# Patient Record
Sex: Female | Born: 1935 | Race: Black or African American | Hispanic: No | Marital: Single | Smoking: Never smoker
Health system: Southern US, Community
[De-identification: ages and names within clinical notes are randomized; demographics above are authoritative.]

## PROBLEM LIST (undated history)

## (undated) DIAGNOSIS — I639 Cerebral infarction, unspecified: Secondary | ICD-10-CM

## (undated) DIAGNOSIS — I10 Essential (primary) hypertension: Secondary | ICD-10-CM

## (undated) DIAGNOSIS — E119 Type 2 diabetes mellitus without complications: Secondary | ICD-10-CM

## (undated) DIAGNOSIS — F0390 Unspecified dementia without behavioral disturbance: Secondary | ICD-10-CM

## (undated) DIAGNOSIS — F32A Depression, unspecified: Secondary | ICD-10-CM

## (undated) DIAGNOSIS — K219 Gastro-esophageal reflux disease without esophagitis: Secondary | ICD-10-CM

## (undated) DIAGNOSIS — D649 Anemia, unspecified: Secondary | ICD-10-CM

## (undated) DIAGNOSIS — I219 Acute myocardial infarction, unspecified: Secondary | ICD-10-CM

## (undated) DIAGNOSIS — F329 Major depressive disorder, single episode, unspecified: Secondary | ICD-10-CM

## (undated) DIAGNOSIS — N189 Chronic kidney disease, unspecified: Secondary | ICD-10-CM

## (undated) HISTORY — PX: CHOLECYSTECTOMY: SHX55

---

## 2011-03-01 ENCOUNTER — Emergency Department: Payer: Self-pay | Admitting: Emergency Medicine

## 2013-06-16 ENCOUNTER — Emergency Department: Payer: Self-pay | Admitting: Emergency Medicine

## 2013-06-16 LAB — COMPREHENSIVE METABOLIC PANEL
ALT: 22 U/L (ref 12–78)
ANION GAP: 6 — AB (ref 7–16)
Albumin: 3.4 g/dL (ref 3.4–5.0)
Alkaline Phosphatase: 115 U/L
BUN: 45 mg/dL — ABNORMAL HIGH (ref 7–18)
Bilirubin,Total: 0.3 mg/dL (ref 0.2–1.0)
CALCIUM: 8.3 mg/dL — AB (ref 8.5–10.1)
CHLORIDE: 112 mmol/L — AB (ref 98–107)
Co2: 22 mmol/L (ref 21–32)
Creatinine: 1.53 mg/dL — ABNORMAL HIGH (ref 0.60–1.30)
EGFR (African American): 35 — ABNORMAL LOW
EGFR (Non-African Amer.): 30 — ABNORMAL LOW
Glucose: 243 mg/dL — ABNORMAL HIGH (ref 65–99)
Osmolality: 299 (ref 275–301)
Potassium: 5.1 mmol/L (ref 3.5–5.1)
SGOT(AST): 9 U/L — ABNORMAL LOW (ref 15–37)
SODIUM: 140 mmol/L (ref 136–145)
Total Protein: 7.1 g/dL (ref 6.4–8.2)

## 2013-06-16 LAB — URINALYSIS, COMPLETE
Specific Gravity: 1.014 (ref 1.003–1.030)
WBC UR: 69 /HPF (ref 0–5)

## 2013-06-16 LAB — CBC
HCT: 29.3 % — ABNORMAL LOW (ref 35.0–47.0)
HGB: 9.4 g/dL — ABNORMAL LOW (ref 12.0–16.0)
MCH: 27.4 pg (ref 26.0–34.0)
MCHC: 31.9 g/dL — AB (ref 32.0–36.0)
MCV: 86 fL (ref 80–100)
Platelet: 134 10*3/uL — ABNORMAL LOW (ref 150–440)
RBC: 3.41 10*6/uL — ABNORMAL LOW (ref 3.80–5.20)
RDW: 14.2 % (ref 11.5–14.5)
WBC: 4.9 10*3/uL (ref 3.6–11.0)

## 2013-06-19 LAB — URINE CULTURE

## 2013-10-08 ENCOUNTER — Emergency Department: Payer: Self-pay | Admitting: Internal Medicine

## 2013-10-08 LAB — URINALYSIS, COMPLETE
BILIRUBIN, UR: NEGATIVE
KETONE: NEGATIVE
Nitrite: NEGATIVE
PH: 5 (ref 4.5–8.0)
Protein: 30
RBC,UR: 7 /HPF (ref 0–5)
SPECIFIC GRAVITY: 1.014 (ref 1.003–1.030)
Squamous Epithelial: <1
WBC UR: 8 /HPF (ref 0–5)

## 2013-10-08 LAB — CBC
HCT: 30.2 % — ABNORMAL LOW (ref 35.0–47.0)
HGB: 9.6 g/dL — ABNORMAL LOW (ref 12.0–16.0)
MCH: 27.5 pg (ref 26.0–34.0)
MCHC: 32 g/dL (ref 32.0–36.0)
MCV: 86 fL (ref 80–100)
PLATELETS: 164 10*3/uL (ref 150–440)
RBC: 3.51 10*6/uL — ABNORMAL LOW (ref 3.80–5.20)
RDW: 14 % (ref 11.5–14.5)
WBC: 2.9 10*3/uL — AB (ref 3.6–11.0)

## 2013-10-08 LAB — COMPREHENSIVE METABOLIC PANEL
ALK PHOS: 84 U/L
Albumin: 3.4 g/dL (ref 3.4–5.0)
Anion Gap: 6 — ABNORMAL LOW (ref 7–16)
BUN: 28 mg/dL — AB (ref 7–18)
Bilirubin,Total: 0.4 mg/dL (ref 0.2–1.0)
Calcium, Total: 8.7 mg/dL (ref 8.5–10.1)
Chloride: 111 mmol/L — ABNORMAL HIGH (ref 98–107)
Co2: 23 mmol/L (ref 21–32)
Creatinine: 1.13 mg/dL (ref 0.60–1.30)
EGFR (African American): 50 — ABNORMAL LOW
GFR CALC NON AF AMER: 43 — AB
Glucose: 235 mg/dL — ABNORMAL HIGH (ref 65–99)
Osmolality: 292 (ref 275–301)
POTASSIUM: 5.1 mmol/L (ref 3.5–5.1)
SGOT(AST): 14 U/L — ABNORMAL LOW (ref 15–37)
SGPT (ALT): 27 U/L (ref 12–78)
SODIUM: 140 mmol/L (ref 136–145)
Total Protein: 7 g/dL (ref 6.4–8.2)

## 2013-10-08 LAB — TROPONIN I: Troponin-I: <0.02 ng/mL

## 2013-10-10 LAB — URINE CULTURE

## 2015-08-03 ENCOUNTER — Inpatient Hospital Stay
Admission: EM | Admit: 2015-08-03 | Discharge: 2015-08-07 | DRG: 871 | Disposition: A | Discharge status: Skilled Nursing Facility | Payer: Medicare Other | Attending: Internal Medicine | Admitting: Internal Medicine

## 2015-08-03 ENCOUNTER — Emergency Department: Payer: Medicare Other

## 2015-08-03 DIAGNOSIS — M47812 Spondylosis without myelopathy or radiculopathy, cervical region: Secondary | ICD-10-CM | POA: Diagnosis present

## 2015-08-03 DIAGNOSIS — Z681 Body mass index (BMI) 19 or less, adult: Secondary | ICD-10-CM

## 2015-08-03 DIAGNOSIS — Z794 Long term (current) use of insulin: Secondary | ICD-10-CM

## 2015-08-03 DIAGNOSIS — Z880 Allergy status to penicillin: Secondary | ICD-10-CM | POA: Diagnosis not present

## 2015-08-03 DIAGNOSIS — F0390 Unspecified dementia without behavioral disturbance: Secondary | ICD-10-CM | POA: Diagnosis present

## 2015-08-03 DIAGNOSIS — N189 Chronic kidney disease, unspecified: Secondary | ICD-10-CM | POA: Diagnosis present

## 2015-08-03 DIAGNOSIS — A419 Sepsis, unspecified organism: Secondary | ICD-10-CM | POA: Diagnosis present

## 2015-08-03 DIAGNOSIS — R6520 Severe sepsis without septic shock: Secondary | ICD-10-CM | POA: Diagnosis present

## 2015-08-03 DIAGNOSIS — F329 Major depressive disorder, single episode, unspecified: Secondary | ICD-10-CM | POA: Diagnosis present

## 2015-08-03 DIAGNOSIS — R778 Other specified abnormalities of plasma proteins: Secondary | ICD-10-CM

## 2015-08-03 DIAGNOSIS — E43 Unspecified severe protein-calorie malnutrition: Secondary | ICD-10-CM | POA: Diagnosis present

## 2015-08-03 DIAGNOSIS — F0280 Dementia in other diseases classified elsewhere without behavioral disturbance: Secondary | ICD-10-CM | POA: Diagnosis present

## 2015-08-03 DIAGNOSIS — Z8673 Personal history of transient ischemic attack (TIA), and cerebral infarction without residual deficits: Secondary | ICD-10-CM | POA: Diagnosis not present

## 2015-08-03 DIAGNOSIS — R410 Disorientation, unspecified: Secondary | ICD-10-CM

## 2015-08-03 DIAGNOSIS — R600 Localized edema: Secondary | ICD-10-CM

## 2015-08-03 DIAGNOSIS — G9340 Encephalopathy, unspecified: Secondary | ICD-10-CM | POA: Diagnosis present

## 2015-08-03 DIAGNOSIS — R7989 Other specified abnormal findings of blood chemistry: Secondary | ICD-10-CM

## 2015-08-03 DIAGNOSIS — I252 Old myocardial infarction: Secondary | ICD-10-CM

## 2015-08-03 DIAGNOSIS — I2510 Atherosclerotic heart disease of native coronary artery without angina pectoris: Secondary | ICD-10-CM | POA: Diagnosis present

## 2015-08-03 DIAGNOSIS — E1122 Type 2 diabetes mellitus with diabetic chronic kidney disease: Secondary | ICD-10-CM | POA: Diagnosis present

## 2015-08-03 DIAGNOSIS — I639 Cerebral infarction, unspecified: Secondary | ICD-10-CM

## 2015-08-03 DIAGNOSIS — I129 Hypertensive chronic kidney disease with stage 1 through stage 4 chronic kidney disease, or unspecified chronic kidney disease: Secondary | ICD-10-CM | POA: Diagnosis present

## 2015-08-03 DIAGNOSIS — E785 Hyperlipidemia, unspecified: Secondary | ICD-10-CM | POA: Diagnosis present

## 2015-08-03 DIAGNOSIS — N390 Urinary tract infection, site not specified: Secondary | ICD-10-CM

## 2015-08-03 DIAGNOSIS — I248 Other forms of acute ischemic heart disease: Secondary | ICD-10-CM | POA: Diagnosis present

## 2015-08-03 DIAGNOSIS — I482 Chronic atrial fibrillation: Secondary | ICD-10-CM | POA: Diagnosis present

## 2015-08-03 DIAGNOSIS — I10 Essential (primary) hypertension: Secondary | ICD-10-CM | POA: Diagnosis present

## 2015-08-03 DIAGNOSIS — D649 Anemia, unspecified: Secondary | ICD-10-CM | POA: Diagnosis present

## 2015-08-03 DIAGNOSIS — K219 Gastro-esophageal reflux disease without esophagitis: Secondary | ICD-10-CM | POA: Diagnosis present

## 2015-08-03 HISTORY — DX: Major depressive disorder, single episode, unspecified: F32.9

## 2015-08-03 HISTORY — DX: Acute myocardial infarction, unspecified: I21.9

## 2015-08-03 HISTORY — DX: Anemia, unspecified: D64.9

## 2015-08-03 HISTORY — DX: Essential (primary) hypertension: I10

## 2015-08-03 HISTORY — DX: Cerebral infarction, unspecified: I63.9

## 2015-08-03 HISTORY — DX: Depression, unspecified: F32.A

## 2015-08-03 HISTORY — DX: Type 2 diabetes mellitus without complications: E11.9

## 2015-08-03 HISTORY — DX: Gastro-esophageal reflux disease without esophagitis: K21.9

## 2015-08-03 HISTORY — DX: Unspecified dementia, unspecified severity, without behavioral disturbance, psychotic disturbance, mood disturbance, and anxiety: F03.90

## 2015-08-03 HISTORY — DX: Chronic kidney disease, unspecified: N18.9

## 2015-08-03 LAB — CBC WITH DIFFERENTIAL/PLATELET
Basophils Absolute: 0 10*3/uL (ref 0–0.1)
Basophils Relative: 0 %
EOS ABS: 0 10*3/uL (ref 0–0.7)
Eosinophils Relative: 0 %
HEMATOCRIT: 31.5 % — AB (ref 35.0–47.0)
HEMOGLOBIN: 10.1 g/dL — AB (ref 12.0–16.0)
LYMPHS ABS: 0.4 10*3/uL — AB (ref 1.0–3.6)
Lymphocytes Relative: 5 %
MCH: 27.1 pg (ref 26.0–34.0)
MCHC: 32 g/dL (ref 32.0–36.0)
MCV: 84.6 fL (ref 80.0–100.0)
MONO ABS: 0.1 10*3/uL — AB (ref 0.2–0.9)
MONOS PCT: 1 %
NEUTROS PCT: 94 %
Neutro Abs: 8.8 10*3/uL — ABNORMAL HIGH (ref 1.4–6.5)
Platelets: 188 10*3/uL (ref 150–440)
RBC: 3.72 MIL/uL — ABNORMAL LOW (ref 3.80–5.20)
RDW: 16.9 % — AB (ref 11.5–14.5)
WBC: 9.3 10*3/uL (ref 3.6–11.0)

## 2015-08-03 LAB — COMPREHENSIVE METABOLIC PANEL
ALBUMIN: 4.1 g/dL (ref 3.5–5.0)
ALT: 30 U/L (ref 14–54)
AST: 27 U/L (ref 15–41)
Alkaline Phosphatase: 99 U/L (ref 38–126)
Anion gap: 9 (ref 5–15)
BILIRUBIN TOTAL: 0.7 mg/dL (ref 0.3–1.2)
BUN: 37 mg/dL — AB (ref 6–20)
CO2: 20 mmol/L — ABNORMAL LOW (ref 22–32)
CREATININE: 1.5 mg/dL — AB (ref 0.44–1.00)
Calcium: 8.9 mg/dL (ref 8.9–10.3)
Chloride: 110 mmol/L (ref 101–111)
GFR calc Af Amer: 34 mL/min — ABNORMAL LOW (ref >60)
GFR, EST NON AFRICAN AMERICAN: 29 mL/min — AB (ref >60)
GLUCOSE: 315 mg/dL — AB (ref 65–99)
POTASSIUM: 5.6 mmol/L — AB (ref 3.5–5.1)
Sodium: 139 mmol/L (ref 135–145)
TOTAL PROTEIN: 8.1 g/dL (ref 6.5–8.1)

## 2015-08-03 LAB — TROPONIN I
Troponin I: 0.21 ng/mL — ABNORMAL HIGH (ref <0.031)
Troponin I: 0.33 ng/mL — ABNORMAL HIGH (ref <0.031)
Troponin I: 0.37 ng/mL — ABNORMAL HIGH (ref <0.031)

## 2015-08-03 LAB — PROTIME-INR
INR: 1.01
Prothrombin Time: 13.5 seconds (ref 11.4–15.0)

## 2015-08-03 LAB — GLUCOSE, CAPILLARY
Glucose-Capillary: 312 mg/dL — ABNORMAL HIGH (ref 65–99)
Glucose-Capillary: 241 mg/dL — ABNORMAL HIGH (ref 65–99)

## 2015-08-03 LAB — RAPID INFLUENZA A&B ANTIGENS (ARMC ONLY)
INFLUENZA A (ARMC): NEGATIVE
INFLUENZA B (ARMC): NEGATIVE

## 2015-08-03 LAB — URINALYSIS COMPLETE WITH MICROSCOPIC (ARMC ONLY)
Bilirubin Urine: NEGATIVE
GLUCOSE, UA: 150 mg/dL — AB
HGB URINE DIPSTICK: NEGATIVE
NITRITE: NEGATIVE
Protein, ur: >500 mg/dL — AB
SPECIFIC GRAVITY, URINE: 1.015 (ref 1.005–1.030)
pH: 9 — ABNORMAL HIGH (ref 5.0–8.0)

## 2015-08-03 LAB — BRAIN NATRIURETIC PEPTIDE: B NATRIURETIC PEPTIDE 5: 812 pg/mL — AB (ref 0.0–100.0)

## 2015-08-03 LAB — POTASSIUM: Potassium: 5.3 mmol/L — ABNORMAL HIGH (ref 3.5–5.1)

## 2015-08-03 LAB — APTT: APTT: 30 s (ref 24–36)

## 2015-08-03 LAB — LACTIC ACID, PLASMA
LACTIC ACID, VENOUS: 1.8 mmol/L (ref 0.5–2.0)
Lactic Acid, Venous: 1.9 mmol/L (ref 0.5–2.0)

## 2015-08-03 LAB — MRSA PCR SCREENING: MRSA by PCR: NEGATIVE

## 2015-08-03 MED ORDER — ACETAMINOPHEN 650 MG RE SUPP
650.0000 mg | Freq: Four times a day (QID) | RECTAL | Status: DC | PRN
Start: 2015-08-03 — End: 2015-08-07

## 2015-08-03 MED ORDER — DOCUSATE SODIUM 100 MG PO CAPS: 100.0000 mg | ORAL_CAPSULE | Freq: Two times a day (BID) | ORAL | Status: DC

## 2015-08-03 MED ORDER — SODIUM CHLORIDE 0.9% FLUSH
3.0000 mL | Freq: Two times a day (BID) | INTRAVENOUS | Status: DC
  Administered 2015-08-03 – 2015-08-06 (×7): 3 mL via INTRAVENOUS

## 2015-08-03 MED ORDER — ACETAMINOPHEN 325 MG PO TABS: 650.0000 mg | ORAL_TABLET | Freq: Four times a day (QID) | ORAL | Status: DC | PRN

## 2015-08-03 MED ORDER — VANCOMYCIN HCL IN DEXTROSE 1-5 GM/200ML-% IV SOLN
1000.0000 mg | Freq: Once | INTRAVENOUS | Status: AC
  Administered 2015-08-03: 1000 mg via INTRAVENOUS
  Filled 2015-08-03: qty 200

## 2015-08-03 MED ORDER — ONDANSETRON HCL 4 MG PO TABS: 4.0000 mg | ORAL_TABLET | Freq: Four times a day (QID) | ORAL | Status: DC | PRN

## 2015-08-03 MED ORDER — TAMSULOSIN HCL 0.4 MG PO CAPS: 0.4000 mg | ORAL_CAPSULE | Freq: Every day | ORAL | Status: DC

## 2015-08-03 MED ORDER — AZTREONAM 1 G IJ SOLR
1.0000 g | Freq: Three times a day (TID) | INTRAMUSCULAR | Status: DC
  Administered 2015-08-03 – 2015-08-07 (×12): 1 g via INTRAVENOUS
  Filled 2015-08-03 (×15): qty 1

## 2015-08-03 MED ORDER — BISACODYL 10 MG RE SUPP: 10.0000 mg | Freq: Every day | RECTAL | Status: DC | PRN

## 2015-08-03 MED ORDER — SODIUM CHLORIDE 0.9 % IV BOLUS (SEPSIS): 1000.0000 mL | Freq: Once | INTRAVENOUS | Status: DC

## 2015-08-03 MED ORDER — ASPIRIN EC 81 MG PO TBEC
81.0000 mg | DELAYED_RELEASE_TABLET | Freq: Every day | ORAL | Status: DC
  Administered 2015-08-05 – 2015-08-07 (×3): 81 mg via ORAL
  Filled 2015-08-03 (×3): qty 1

## 2015-08-03 MED ORDER — NITROGLYCERIN 2 % TD OINT
2.0000 [in_us] | TOPICAL_OINTMENT | Freq: Four times a day (QID) | TRANSDERMAL | Status: DC
  Administered 2015-08-03 – 2015-08-06 (×11): 2 [in_us] via TOPICAL
  Filled 2015-08-03 (×10): qty 2

## 2015-08-03 MED ORDER — INSULIN ASPART 100 UNIT/ML ~~LOC~~ SOLN
0.0000 [IU] | Freq: Three times a day (TID) | SUBCUTANEOUS | Status: DC
  Administered 2015-08-03: 7 [IU] via SUBCUTANEOUS
  Administered 2015-08-04: 3 [IU] via SUBCUTANEOUS
  Filled 2015-08-03: qty 3
  Filled 2015-08-03: qty 7
  Filled 2015-08-03: qty 5

## 2015-08-03 MED ORDER — MORPHINE SULFATE (PF) 2 MG/ML IV SOLN
2.0000 mg | INTRAVENOUS | Status: DC | PRN
  Administered 2015-08-03: 2 mg via INTRAVENOUS
  Filled 2015-08-03 (×3): qty 1

## 2015-08-03 MED ORDER — SODIUM CHLORIDE 0.9 % IV BOLUS (SEPSIS)
1314.0000 mL | Freq: Once | INTRAVENOUS | Status: AC
  Administered 2015-08-03: 1314 mL via INTRAVENOUS

## 2015-08-03 MED ORDER — HYDRALAZINE HCL 20 MG/ML IJ SOLN
5.0000 mg | Freq: Once | INTRAMUSCULAR | Status: AC
  Administered 2015-08-03: 5 mg via INTRAVENOUS
  Filled 2015-08-03: qty 1

## 2015-08-03 MED ORDER — LORAZEPAM 2 MG/ML IJ SOLN
0.5000 mg | INTRAMUSCULAR | Status: DC | PRN
  Administered 2015-08-04 – 2015-08-07 (×4): 0.5 mg via INTRAVENOUS
  Filled 2015-08-03 (×4): qty 1

## 2015-08-03 MED ORDER — DONEPEZIL HCL 5 MG PO TABS: 5.0000 mg | ORAL_TABLET | Freq: Every day | ORAL | Status: DC

## 2015-08-03 MED ORDER — SODIUM CHLORIDE 0.9 % IV SOLN
INTRAVENOUS | Status: DC
  Administered 2015-08-03 – 2015-08-05 (×4): via INTRAVENOUS

## 2015-08-03 MED ORDER — ACETAMINOPHEN 650 MG RE SUPP
650.0000 mg | Freq: Once | RECTAL | Status: AC
  Administered 2015-08-03: 650 mg via RECTAL
  Filled 2015-08-03: qty 1

## 2015-08-03 MED ORDER — MIRTAZAPINE 15 MG PO TABS
7.5000 mg | ORAL_TABLET | Freq: Every day | ORAL | Status: DC
  Administered 2015-08-05 – 2015-08-06 (×2): 7.5 mg via ORAL
  Filled 2015-08-03 (×2): qty 1

## 2015-08-03 MED ORDER — ONDANSETRON HCL 4 MG/2ML IJ SOLN: 4.0000 mg | Freq: Four times a day (QID) | INTRAMUSCULAR | Status: DC | PRN

## 2015-08-03 MED ORDER — PANTOPRAZOLE SODIUM 40 MG PO TBEC: 40.0000 mg | DELAYED_RELEASE_TABLET | Freq: Every day | ORAL | Status: DC

## 2015-08-03 MED ORDER — DEXTROSE 5 % IV SOLN
2.0000 g | Freq: Three times a day (TID) | INTRAVENOUS | Status: DC
  Administered 2015-08-03: 2 g via INTRAVENOUS
  Filled 2015-08-03 (×4): qty 2

## 2015-08-03 MED ORDER — HYDRALAZINE HCL 25 MG PO TABS
50.0000 mg | ORAL_TABLET | Freq: Three times a day (TID) | ORAL | Status: DC
  Filled 2015-08-03: qty 2

## 2015-08-03 MED ORDER — HEPARIN SODIUM (PORCINE) 5000 UNIT/ML IJ SOLN
5000.0000 [IU] | Freq: Three times a day (TID) | INTRAMUSCULAR | Status: DC
Start: 2015-08-03 — End: 2015-08-04
  Administered 2015-08-03 – 2015-08-04 (×2): 5000 [IU] via SUBCUTANEOUS
  Filled 2015-08-03 (×2): qty 1

## 2015-08-03 MED ORDER — HYDRALAZINE HCL 20 MG/ML IJ SOLN
10.0000 mg | INTRAMUSCULAR | Status: DC | PRN
  Administered 2015-08-03 – 2015-08-04 (×4): 10 mg via INTRAVENOUS
  Filled 2015-08-03 (×4): qty 1

## 2015-08-03 NOTE — Progress Notes (Signed)
PT Cancellation Note  Patient Details Name: Stephanie Willis MRN: [id number] DOB: 08/06/1921   Cancelled Treatment:    Reason Eval/Treat Not Completed: Patient not medically ready Pt has elevated troponin, BP and glucose levels.  Pt is not appropriate for PT at this time.   Loran SentersGalen Waino Mounsey, PT, DPT [phone]  Malachi ProGalen R Ediberto Sens 08/03/2015, 4:35 PM

## 2015-08-03 NOTE — H&P (Signed)
History and Physical    Stephanie Willis ONG:[id number] DOB: 03-04-1922 DOA: 08/03/2015  Referring physician: Dr. Inocencio Homes PCP: No primary care provider on file.  Specialists: none  Chief Complaint: confusion  HPI: Stephanie Willis is a 82 y.o. female has a past medical history significant for CAD s/p MI, Dementia, previous stroke, HTN, DM, and OAB who resides at [facility]. According to the SNF, the pt is demented and agitated at baseline. Brother states she was well 4 days ago but is now severely confused and agitated. She was brought to the ER today where she was noted to be febrile and tachycardic. UA was grossly abnormal. SNF states she has had a left gaze preference. CT of head and neck in ER unchanged, The pt is unable to provide hx. She is now admitted for presumed urosepsis. Of note, troponin was also elevated ing ER and glucose=315.  Review of Systems: unable to obtain from patient due to dementia and delirium. Brother has not had any contact with her in 4 days.  Past Medical History  Diagnosis Date  . Stroke (HCC)   . Myocardial infarction (HCC)   . Anemia   . Depression   . Dementia   . Diabetes mellitus without complication (HCC)   . Hypertension   . Chronic kidney disease   . GERD (gastroesophageal reflux disease)    Past Surgical History  Procedure Laterality Date  . Cholecystectomy     Social History:  reports that she has never smoked. She does not have any smokeless tobacco history on file. Her alcohol and drug histories are not on file.  Allergies  Allergen Reactions  . Penicillins     Other reaction(s): Unknown    Family History  Problem Relation Age of Onset  . Family history unknown: Yes    Prior to Admission medications   Not on File   Physical Exam: Filed Vitals:   08/03/15 0839 08/03/15 0900 08/03/15 0949 08/03/15 0951  BP: 226/95 171/131 208/111 216/91  Pulse:  85  101  Temp:      TempSrc:      Resp:  19    Height:      Weight:      SpO2:  97%        General:  Acutely ill appearing, agitated, non-verbal. [place]/AT, mild cachexia  Eyes: PERRL, EOMI, no scleral icterus, conjunctiva clear  ENT: dry oropharynx with poor dentition, TM's benign. No lesions or exudate  Neck: supple, no lymphadenopathy. No thyromegaly or bruits noted  Cardiovascular: regular rate without MRG; 2+ peripheral pulses, no JVD, 1+ peripheral edema on right  Respiratory: diffuse rhonchi without wheezes or rales. No dullness. Respiratory effort increased.  Abdomen: soft, non tender to palpation, positive bowel sounds, no guarding, no rebound, no organomegaly  Skin: no rashes or lesions  Musculoskeletal: assymetry of LE ? Due to atrophy of LLE, no joint swelling  Psychiatric: confused, non-verbal, agitated  Neurologic: CN 2-12 grossly intact, MS 4/5 on left, DTR's diminished, stocking glove neuropathy noted  Labs on Admission:  Basic Metabolic Panel:  Recent Labs Lab 08/03/15 0817  NA 139  K 5.6*  CL 110  CO2 20*  GLUCOSE 315*  BUN 37*  CREATININE 1.50*  CALCIUM 8.9   Liver Function Tests:  Recent Labs Lab 08/03/15 0817  AST 27  ALT 30  ALKPHOS 99  BILITOT 0.7  PROT 8.1  ALBUMIN 4.1   No results for input(s): LIPASE, AMYLASE in the last 168 hours. No results for input(s):  AMMONIA in the last 168 hours. CBC:  Recent Labs Lab 08/03/15 0817  WBC 9.3  NEUTROABS 8.8*  HGB 10.1*  HCT 31.5*  MCV 84.6  PLT 188   Cardiac Enzymes:  Recent Labs Lab 08/03/15 0817  TROPONINI 0.21*    BNP (last 3 results)  Recent Labs  08/03/15 0817  BNP 812.0*    ProBNP (last 3 results) No results for input(s): PROBNP in the last 8760 hours.  CBG: No results for input(s): GLUCAP in the last 168 hours.  Radiological Exams on Admission: Ct Head Wo Contrast  08/03/2015  CLINICAL DATA:  Altered mental status, unresponsive, possible unwitnessed trauma EXAM: CT HEAD WITHOUT CONTRAST CT CERVICAL SPINE WITHOUT CONTRAST TECHNIQUE: Multidetector  CT imaging of the head and cervical spine was performed following the standard protocol without intravenous contrast. Multiplanar CT image reconstructions of the cervical spine were also generated. COMPARISON:  10/08/2013 FINDINGS: CT HEAD FINDINGS Stable brain atrophy pattern and chronic white matter microvascular ischemic changes throughout the cerebral hemispheres. Stable partially calcified hyperdense extra-axial mass along the right frontal parafalcine region measure approximately 19 x 14 mm compatible with a meningioma. No acute intracranial hemorrhage, acute infarction, focal mass effect, edema, midline shift, herniation, hydrocephalus, or extra-axial fluid collection. Cisterns are patent. Cerebellar atrophy as well. Orbits are symmetric. Atherosclerosis of the intracranial vessels at the skullbase. Skull appears intact. No depressed fracture. Mastoids and sinuses remain clear. CT CERVICAL SPINE FINDINGS Normal cervical spine alignment without subluxation or dislocation. Slight progression of degenerative disc disease and spondylosis at C2-3 with increased endplate sclerosis and irregularity and osteophyte formation. Similar moderate degenerative changes at C5-6 with anterior overhanging osteophytes. Diffuse multilevel facet arthropathy noted. Odontoid appears intact. Rotation at the C1-2 articulation appears to be positional. No acute displaced fracture evident. Carotid atherosclerosis noted. No soft tissue asymmetry in the neck. Clear lung apices. IMPRESSION: No acute intracranial abnormality or interval change. Stable small right frontal parafalcine Extra-axial mass compatible with a meningioma. Chronic microvascular ischemic changes Diffuse cervical spondylosis and degenerative changes, slight progression since prior study at C2-3. No acute cervical osseous finding or malalignment Electronically Signed   By: Judie Petit.  Shick M.D.   On: 08/03/2015 09:15   Ct Cervical Spine Wo Contrast  08/03/2015  CLINICAL  DATA:  Altered mental status, unresponsive, possible unwitnessed trauma EXAM: CT HEAD WITHOUT CONTRAST CT CERVICAL SPINE WITHOUT CONTRAST TECHNIQUE: Multidetector CT imaging of the head and cervical spine was performed following the standard protocol without intravenous contrast. Multiplanar CT image reconstructions of the cervical spine were also generated. COMPARISON:  10/08/2013 FINDINGS: CT HEAD FINDINGS Stable brain atrophy pattern and chronic white matter microvascular ischemic changes throughout the cerebral hemispheres. Stable partially calcified hyperdense extra-axial mass along the right frontal parafalcine region measure approximately 19 x 14 mm compatible with a meningioma. No acute intracranial hemorrhage, acute infarction, focal mass effect, edema, midline shift, herniation, hydrocephalus, or extra-axial fluid collection. Cisterns are patent. Cerebellar atrophy as well. Orbits are symmetric. Atherosclerosis of the intracranial vessels at the skullbase. Skull appears intact. No depressed fracture. Mastoids and sinuses remain clear. CT CERVICAL SPINE FINDINGS Normal cervical spine alignment without subluxation or dislocation. Slight progression of degenerative disc disease and spondylosis at C2-3 with increased endplate sclerosis and irregularity and osteophyte formation. Similar moderate degenerative changes at C5-6 with anterior overhanging osteophytes. Diffuse multilevel facet arthropathy noted. Odontoid appears intact. Rotation at the C1-2 articulation appears to be positional. No acute displaced fracture evident. Carotid atherosclerosis noted. No soft tissue asymmetry in the  neck. Clear lung apices. IMPRESSION: No acute intracranial abnormality or interval change. Stable small right frontal parafalcine Extra-axial mass compatible with a meningioma. Chronic microvascular ischemic changes Diffuse cervical spondylosis and degenerative changes, slight progression since prior study at C2-3. No acute  cervical osseous finding or malalignment Electronically Signed   By: Judie PetitM.  Shick M.D.   On: 08/03/2015 09:15   Dg Chest Port 1 View  08/03/2015  CLINICAL DATA:  Sepsis. EXAM: PORTABLE CHEST 1 VIEW COMPARISON:  03/01/2011 FINDINGS: Borderline cardiomegaly ectasia of the thoracic aorta are stable. Both lungs are clear. No evidence of pneumothorax or pleural effusion. IMPRESSION: Stable exam.  No active disease. Electronically Signed   By: Myles RosenthalJohn  Stahl M.D.   On: 08/03/2015 08:55    EKG: Independently reviewed.  Assessment/Plan Principal Problem:   Sepsis (HCC) Active Problems:   UTI (lower urinary tract infection)   Delirium   Elevated troponin   Will admit to ICU as FULL CODE per pt's brother with IV fluids and IV ABX. Cultures sent. Will consult PT, CSW, and Palliative Care. Follow sugars with SSI. Monitor BP closely with prn IV Hydralazine. Consult Cardiology due to elevated troponin. Order echo. Repeat labs in AM. Prognosis poor. Family aware.  Diet: NPO Fluids: NS@100  DVT Prophylaxis: SQ Heparin  Code Status: FULL  Family Communication: yes  Disposition Plan: SNF  Time spent: 50 min

## 2015-08-03 NOTE — ED Notes (Addendum)
Attempted transport to U/S. Unable to at this time due to soiled diaper. Will change and call to transport for u/s. Pending antibiotic from pharmacy

## 2015-08-03 NOTE — ED Provider Notes (Signed)
[facility] Emergency Department Provider Note  ____________________________________________  Time seen: Approximately 8:04 AM  I have reviewed the triage vital signs and the nursing notes.   HISTORY  Chief Complaint Code Sepsis  Caveat-history of present illness review of systems Limited due to the patient's altered mental status/decreased level of responsiveness. All information is obtained from EMS on their arrival as well as staff at [facility] health care.  HPI Kimberlye Willis is a 82 y.o. female with history of severe dementia, diabetes, hyperlipidemia, hypertension, prior CVA who presents for evaluation of decreased level of responsiveness today. According to staff at her living facility she was awake last night, agitated but that is her baseline. This morning she had limited response to any verbal stimulus, was not following commands, and was very quiet which is out of character for her. This morning at approximately 6:30 AM when the morning staff came to check on her, they also noted that she had left gaze preference which is new. Staff at [facility] health reports that she has otherwise been in her usual state of health without illness. No vomiting, diarrhea, fevers or chills. No coughing, sneezing, runny nose.   History reviewed. No pertinent past medical history.  There are no active problems to display for this patient.   No past surgical history on file.  No current outpatient prescriptions on file.  Allergies Penicillins  History reviewed. No pertinent family history.  Social History Social History  Substance Use Topics  . Smoking status: None  . Smokeless tobacco: None  . Alcohol Use: None    Review of Systems  Caveat-history of present illness review of systems Limited due to the patient's altered mental status/decreased level of responsiveness. All information is obtained from EMS on their arrival as well as staff at [facility] health  care. ____________________________________________   PHYSICAL EXAM:  VITAL SIGNS: ED Triage Vitals  Enc Vitals Group     BP --      Pulse Rate 08/03/15 0800 106     Resp --      Temp 08/03/15 0800 101.9 F (38.8 C)     Temp Source 08/03/15 0800 Rectal     SpO2 08/03/15 0800 98 %     Weight 08/03/15 0800 170 lb (77.111 kg)     Height 08/03/15 0800  (1.549 m)     Head Cir --      Peak Flow --      Pain Score --      Pain Loc --      Pain Edu? --      Excl. in GC? --     Constitutional: Alert But does not follow commands for the most part, she is not verbalizing. Her eyes open spontaneously. She does appear to have a left gaze preference. + chills. Eyes: Conjunctivae are normal. PERRL. EOMI. Head: Atraumatic. Nose: No congestion/rhinnorhea. Mouth/Throat: Mucous membranes are dry.  Oropharynx non-erythematous. Neck: No stridor.  No cervical spine tenderness to palpation. Cardiovascular: Tachycardic rate, regular rhythm. Grossly normal heart sounds.  Good peripheral circulation. Respiratory: Normal respiratory effort.  No retractions. Diminished breath sounds bilateral bases. Mild tachypnea. Gastrointestinal: Soft and nontender. No distention. No CVA tenderness. Genitourinary: Deferred Musculoskeletal: No lower extremity tenderness nor edema.  No joint effusions. Small bruise on the left shoulder. Neurologic: The patient briefly followed commands to squeeze my hand with her left hand but otherwise does not move any extremities to command. Left gaze preference vs deviation. Skin:  Skin is warm,  dry and intact. No rash noted. Psychiatric: Unable to assess secondary to severity of illness.  ____________________________________________   LABS (all labs ordered are listed, but only abnormal results are displayed)  Labs Reviewed  COMPREHENSIVE METABOLIC PANEL - Abnormal; Notable for the following:    Potassium 5.6 (*)    CO2 20 (*)    Glucose, Bld 315 (*)    BUN 37 (*)     Creatinine, Ser 1.50 (*)    GFR calc non Af Amer 29 (*)    GFR calc Af Amer 34 (*)    All other components within normal limits  BRAIN NATRIURETIC PEPTIDE - Abnormal; Notable for the following:    B Natriuretic Peptide 812.0 (*)    All other components within normal limits  TROPONIN I - Abnormal; Notable for the following:    Troponin I 0.21 (*)    All other components within normal limits  CBC WITH DIFFERENTIAL/PLATELET - Abnormal; Notable for the following:    RBC 3.72 (*)    Hemoglobin 10.1 (*)    HCT 31.5 (*)    RDW 16.9 (*)    Neutro Abs 8.8 (*)    Lymphs Abs 0.4 (*)    Monocytes Absolute 0.1 (*)    All other components within normal limits  URINALYSIS COMPLETEWITH MICROSCOPIC ([facility] ONLY) - Abnormal; Notable for the following:    Color, Urine YELLOW (*)    APPearance TURBID (*)    Glucose, UA 150 (*)    Ketones, ur 1+ (*)    pH 9.0 (*)    Protein, ur >500 (*)    Leukocytes, UA 2+ (*)    Bacteria, UA MANY (*)    Squamous Epithelial / LPF 0-5 (*)    All other components within normal limits  RAPID INFLUENZA A&B ANTIGENS ([facility] ONLY)  CULTURE, BLOOD (ROUTINE X 2)  CULTURE, BLOOD (ROUTINE X 2)  URINE CULTURE  LACTIC ACID, PLASMA  APTT  PROTIME-INR  LACTIC ACID, PLASMA   ____________________________________________  EKG  ED ECG REPORT I, Gayla DossGayle, Marijo Quizon A, the attending physician, personally viewed and interpreted this ECG.   Date: 08/03/2015  EKG Time: 08:28  Rate: 99  Rhythm: normal sinus rhythm  Axis: normal  Intervals:none  ST&T Change: No acute ST elevation.  ____________________________________________  RADIOLOGY  CXR IMPRESSION: Stable exam. No active disease.   CT head and c-spine IMPRESSION: No acute intracranial abnormality or interval change. Stable small right frontal parafalcine Extra-axial mass compatible with a meningioma.  Chronic microvascular ischemic changes  Diffuse cervical spondylosis and degenerative changes,  slight progression since prior study at C2-3.  No acute cervical osseous finding or malalignment ____________________________________________   PROCEDURES  Procedure(s) performed: None  Critical Care performed: Yes, see critical care note(s). Total critical care time spent 35 minutes.  ____________________________________________   INITIAL IMPRESSION / ASSESSMENT AND PLAN / ED COURSE  Pertinent labs & imaging results that were available during my care of the patient were reviewed by me and considered in my medical decision making (see chart for details).  Stephanie Willis is a 82 y.o. female with history of severe dementia, diabetes, hyperlipidemia, hypertension, prior CVA who presents for evaluation of decreased level of responsiveness today. On arrival to the emergency department she is awake but does not verbalize, does not follow commands for the most part. She is febrile and tachycardic meeting 2 out of 4 Sirs criteria. Code sepsis initiated on her arrival. We'll give IV fluids, vancomycin, and history and exam, antipyretics. She has  limited cooperation with formal neurological examination but she does have what appears to be a new left gaze preference. We'll obtain CT head. Chest x-ray, labs, urinalysis pending. Anticipate admission.  ----------------------------------------- 10:13 AM on 08/03/2015 ----------------------------------------- CT head and C-spine negative for any acute pathology. Chest x-ray shows no acute cardiopulmonary abnormality. Urinalysis consistent with urinary tract infection which is the most likely cause of the patient's sepsis. CMP with mild Creatinine elevation at 1.50, potassium is elevated at 5.6, no acute hyperkalemic EKG changes, IV fluids given. Foley in place for critical I's and O's, if not auto diuresing, she may need Kayexalate but this will be left to the discretion of the admitting physician. Troponin elevated 0.21, likely demand ischemia. CBC with  anemia, hemoglobin 10.1. Blood pressure elevated at 216/91 which hass improved to a systolic of 195 after 5 mg of IV hydralazine. Case discussed with hospitalist, Dr. Judithann Sheen, for admission at this time.  ____________________________________________   FINAL CLINICAL IMPRESSION(S) / ED DIAGNOSES  Final diagnoses:  Sepsis secondary to UTI (HCC)      Gayla Doss, MD 08/03/15 1026

## 2015-08-03 NOTE — ED Notes (Signed)
Patient transported to CT 

## 2015-08-03 NOTE — ED Notes (Signed)
Pt presents via EMS with decreased responsiveness per [facility].

## 2015-08-03 NOTE — ED Notes (Signed)
Called receiving nurse at 1433, told to call back in 5 minutes. Called at 1438, nurse unavailable given extenstion to call when ready.

## 2015-08-03 NOTE — Consult Note (Signed)
Pharmacy Antibiotic Note  Stephanie Willis is a 82 y.o. female admitted on 08/03/2015 with sepsis.  Pharmacy has been consulted for aztreonam dosing due to PCN allergy listed in CareEverywhere (unknown reaction).  Of note, no CrCl in system, but estimated around 39 ml/min based on last Scr in 2015. Will update dose based on current labs.   ADD: Estimated CrCl = 23 ml/min   Plan:  Will change to Azactam 1 g IV q8 hours.   Height: 5\' 1"  (154.9 cm) Weight: 170 lb (77.111 kg) IBW/kg (Calculated) : 47.8  Temp (24hrs), Avg:100.5 F (38.1 C), Min:100.1 F (37.8 C), Max:101.9 F (38.8 C)  Estimated Creatinine Clearance: 22.9 mL/min (by C-G formula based on Cr of 1.5).    Allergies  Allergen Reactions  . Penicillins     Other reaction(s): Unknown    Antimicrobials this admission: Aztreonam 4/1 >>  Vancomycin 4/1 >>   Thank you for allowing pharmacy to be a part of this patient's care.  Keilany Burnette D 08/03/2015 1:33 PM

## 2015-08-03 NOTE — Consult Note (Signed)
Pharmacy Antibiotic Note  Stephanie Willis is a 82 y.o. female admitted on 08/03/2015 with sepsis.  Pharmacy has been consulted for aztreonam dosing due to PCN allergy listed in CareEverywhere (unknown reaction).  Of note, no CrCl in system, but estimated around 39 ml/min based on last Scr in 2015. Will update dose based on current labs.  Plan:  Will give aztreonam 2g IV q8hrs.   Height: 5\' 1"  (154.9 cm) Weight: 170 lb (77.111 kg) IBW/kg (Calculated) : 47.8  Temp (24hrs), Avg:101.9 F (38.8 C), Min:101.9 F (38.8 C), Max:101.9 F (38.8 C)  CrCl cannot be calculated (Patient has no serum creatinine result on file.).    Allergies not on file  Antimicrobials this admission: Aztreonam 4/1 >>  Vancomycin 4/1 >>   Thank you for allowing pharmacy to be a part of this patient's care.  Stephanie Willis 08/03/2015 8:12 AM

## 2015-08-03 NOTE — Progress Notes (Signed)
Pt arrived from ER not speaking at this time pts brother Alinda MoneyMelvin stated this is not the pts baseline; she is currently on RA with O2 sats upper 90's; blood pressure remains elevated during shift pt unable to take po medications at this time Dr Judithann SheenSparks notified given orders to place 2 inch nitroglycerin paste q6hrs; cardiology consult pending; enteric precautions in place pending cdiff results; pts family updated and questions answered will continue to monitor and assess pt

## 2015-08-03 NOTE — Progress Notes (Signed)
Not able to take PO meds. Dr Rickard Rhymescine notified.

## 2015-08-04 ENCOUNTER — Inpatient Hospital Stay
Admit: 2015-08-04 | Discharge: 2015-08-04 | Disposition: A | Discharge status: Home / Self Care | Payer: Medicare Other | Attending: Internal Medicine | Admitting: Internal Medicine

## 2015-08-04 LAB — CBC
HEMATOCRIT: 28 % — AB (ref 35.0–47.0)
HEMOGLOBIN: 9 g/dL — AB (ref 12.0–16.0)
MCH: 26.8 pg (ref 26.0–34.0)
MCHC: 32.2 g/dL (ref 32.0–36.0)
MCV: 83.3 fL (ref 80.0–100.0)
Platelets: 151 10*3/uL (ref 150–440)
RBC: 3.36 MIL/uL — AB (ref 3.80–5.20)
RDW: 17.6 % — ABNORMAL HIGH (ref 11.5–14.5)
WBC: 8.7 10*3/uL (ref 3.6–11.0)

## 2015-08-04 LAB — COMPREHENSIVE METABOLIC PANEL
ALT: 22 U/L (ref 14–54)
ANION GAP: 10 (ref 5–15)
AST: 19 U/L (ref 15–41)
Albumin: 3.4 g/dL — ABNORMAL LOW (ref 3.5–5.0)
Alkaline Phosphatase: 74 U/L (ref 38–126)
BUN: 45 mg/dL — ABNORMAL HIGH (ref 6–20)
CALCIUM: 8.3 mg/dL — AB (ref 8.9–10.3)
CHLORIDE: 116 mmol/L — AB (ref 101–111)
CO2: 16 mmol/L — AB (ref 22–32)
Creatinine, Ser: 1.58 mg/dL — ABNORMAL HIGH (ref 0.44–1.00)
GFR, EST AFRICAN AMERICAN: 32 mL/min — AB (ref >60)
GFR, EST NON AFRICAN AMERICAN: 27 mL/min — AB (ref >60)
GLUCOSE: 183 mg/dL — AB (ref 65–99)
Potassium: 4.7 mmol/L (ref 3.5–5.1)
SODIUM: 142 mmol/L (ref 135–145)
Total Bilirubin: 0.8 mg/dL (ref 0.3–1.2)
Total Protein: 6.8 g/dL (ref 6.5–8.1)

## 2015-08-04 LAB — GLUCOSE, CAPILLARY
GLUCOSE-CAPILLARY: 213 mg/dL — AB (ref 65–99)
GLUCOSE-CAPILLARY: 119 mg/dL — AB (ref 65–99)
GLUCOSE-CAPILLARY: 129 mg/dL — AB (ref 65–99)
Glucose-Capillary: 159 mg/dL — ABNORMAL HIGH (ref 65–99)
Glucose-Capillary: 101 mg/dL — ABNORMAL HIGH (ref 65–99)

## 2015-08-04 LAB — TROPONIN I: TROPONIN I: 0.3 ng/mL — AB (ref <0.031)

## 2015-08-04 MED ORDER — METOPROLOL TARTRATE 1 MG/ML IV SOLN
5.0000 mg | Freq: Three times a day (TID) | INTRAVENOUS | Status: DC
  Administered 2015-08-04: 5 mg via INTRAVENOUS
  Filled 2015-08-04: qty 5

## 2015-08-04 MED ORDER — ENOXAPARIN SODIUM 30 MG/0.3ML ~~LOC~~ SOLN
30.0000 mg | SUBCUTANEOUS | Status: DC
  Administered 2015-08-04 – 2015-08-07 (×4): 30 mg via SUBCUTANEOUS
  Filled 2015-08-04 (×4): qty 0.3

## 2015-08-04 MED ORDER — METOPROLOL TARTRATE 1 MG/ML IV SOLN: 2.5000 mg | Freq: Three times a day (TID) | INTRAVENOUS | Status: DC

## 2015-08-04 MED ORDER — HYDRALAZINE HCL 20 MG/ML IJ SOLN
10.0000 mg | Freq: Four times a day (QID) | INTRAMUSCULAR | Status: DC | PRN
  Administered 2015-08-05 – 2015-08-07 (×4): 10 mg via INTRAVENOUS
  Filled 2015-08-04 (×4): qty 1

## 2015-08-04 MED ORDER — INSULIN ASPART 100 UNIT/ML ~~LOC~~ SOLN
0.0000 [IU] | SUBCUTANEOUS | Status: DC
  Administered 2015-08-04: 2 [IU] via SUBCUTANEOUS
  Administered 2015-08-04: 1 [IU] via SUBCUTANEOUS
  Administered 2015-08-05: 2 [IU] via SUBCUTANEOUS
  Filled 2015-08-04: qty 2
  Filled 2015-08-04: qty 1
  Filled 2015-08-04: qty 2

## 2015-08-04 MED ORDER — METOPROLOL TARTRATE 1 MG/ML IV SOLN
2.5000 mg | Freq: Two times a day (BID) | INTRAVENOUS | Status: DC
  Administered 2015-08-04 – 2015-08-05 (×2): 2.5 mg via INTRAVENOUS
  Filled 2015-08-04 (×3): qty 5

## 2015-08-04 NOTE — Progress Notes (Signed)
Dr Elpidio AnisSudini notified of patient's having scheduled 5mg  IV metoprolol but currently HR has been running in the 60s.  MD states to discontinue current order and to order 2.5mg  IV metoprolol bid, to start first dose tonight, Hold for HR <60.

## 2015-08-04 NOTE — Consult Note (Signed)
[facility] Cardiology  CARDIOLOGY CONSULT NOTE  Patient ID: Stephanie Willis MRN: [id number] DOB/AGE: 1922-02-02 82 y.o.  Admit date: 08/03/2015 Referring Physician [facility] Primary Physician  Primary Cardiologist  Reason for Consultation Elevated troponin  HPI: 82 year old female referred for evaluation of elevated troponin. The patient has known coronary artery disease, status post prior MI. She also has a history of prior stroke with dementia, currently a resident at a skilled nursing facility. The patient is brought to [facility] emergency room with a four-day history of increasing confusion and agitation. Patient was noted to be febrile, tachycardic. EKG reveals atrial fibrillation with a controlled ventricular rate. Admission labs were notable for positive urine, troponin was borderline elevated. EKG did not reveal any acute ischemic ST-T wave changes.  Review of systems complete and found to be negative unless listed above     Past Medical History  Diagnosis Date  . Stroke (HCC)   . Myocardial infarction (HCC)   . Anemia   . Depression   . Dementia   . Diabetes mellitus without complication (HCC)   . Hypertension   . Chronic kidney disease   . GERD (gastroesophageal reflux disease)     Past Surgical History  Procedure Laterality Date  . Cholecystectomy      Prescriptions prior to admission  Medication Sig Dispense Refill Last Dose  . atorvastatin (LIPITOR) 10 MG tablet Take 10 mg by mouth daily.   unknown  . docusate sodium (COLACE) 100 MG capsule Take 100 mg by mouth daily as needed for mild constipation or moderate constipation.   unknown  . donepezil (ARICEPT) 5 MG tablet Take 5 mg by mouth at bedtime.   unknownmagne  . ferrous sulfate 325 (65 FE) MG tablet Take 325 mg by mouth 3 (three) times daily with meals.   unknown  . glucagon (GLUCAGEN) 1 MG SOLR injection Inject 1 mg into the muscle once as needed for low blood sugar.   unknown  . hydrALAZINE (APRESOLINE) 50 MG tablet Take 50 mg  by mouth 3 (three) times daily.   unknown  . insulin aspart (NOVOLOG) 100 UNIT/ML FlexPen Inject into the skin 4 (four) times daily -  with meals and at bedtime. Inject per sliding scale: If 201-250= 2 units, 251-300= 4 units, 301-350= 6 units. 351-400= 8 units >401 give 8 units and Call MD.   unknown  . Lactobacillus (LACTINEX PO) Take 1 g by mouth 2 (two) times daily with a meal.   unknown  . magnesium oxide (MAG-OX) 400 (241.3 Mg) MG tablet Take 400 mg by mouth daily.   unknown  . mirtazapine (REMERON) 7.5 MG tablet Take 7.5 mg by mouth at bedtime.   unknown  . pantoprazole (PROTONIX) 40 MG tablet Take 40 mg by mouth daily.   unknown  . simethicone (MYLICON) 80 MG chewable tablet Chew 160 mg by mouth 3 (three) times daily.   unknown  . tamsulosin (FLOMAX) 0.4 MG CAPS capsule Take 0.4 mg by mouth at bedtime.   unknown  . traMADol (ULTRAM) 50 MG tablet Take 50 mg by mouth every 6 (six) hours as needed for moderate pain or severe pain.   unknown   Social History   Social History  . Marital Status: Single    Spouse Name: N/A  . Number of Children: N/A  . Years of Education: N/A   Occupational History  . Not on file.   Social History Main Topics  . Smoking status: Never Smoker   . Smokeless tobacco: Not on file  .  Alcohol Use: Not on file  . Drug Use: Not on file  . Sexual Activity: Not on file   Other Topics Concern  . Not on file   Social History Narrative  . No narrative on file    Family History  Problem Relation Age of Onset  . Family history unknown: Yes      Review of systems complete and found to be negative unless listed above      PHYSICAL EXAM  General: Well developed, well nourished, in no acute distress HEENT:  Normocephalic and atramatic Neck:  No JVD.  Lungs: Clear bilaterally to auscultation and percussion. Heart: HRRR . Normal S1 and S2 without gallops or murmurs.  Abdomen: Bowel sounds are positive, abdomen soft and non-tender  Msk:  Back normal,  normal gait. Normal strength and tone for age. Extremities: No clubbing, cyanosis or edema.   Neuro: Alert and oriented X 3. Psych:  Good affect, responds appropriately  Labs:   Lab Results  Component Value Date   WBC 8.7 08/04/2015   HGB 9.0* 08/04/2015   HCT 28.0* 08/04/2015   MCV 83.3 08/04/2015   PLT 151 08/04/2015    Recent Labs Lab 08/04/15 0251  NA 142  K 4.7  CL 116*  CO2 16*  BUN 45*  CREATININE 1.58*  CALCIUM 8.3*  PROT 6.8  BILITOT 0.8  ALKPHOS 74  ALT 22  AST 19  GLUCOSE 183*   Lab Results  Component Value Date   TROPONINI 0.30* 08/04/2015   No results found for: CHOL No results found for: HDL No results found for: LDLCALC No results found for: TRIG No results found for: CHOLHDL No results found for: LDLDIRECT    Radiology: Ct Head Wo Contrast  08/03/2015  CLINICAL DATA:  Altered mental status, unresponsive, possible unwitnessed trauma EXAM: CT HEAD WITHOUT CONTRAST CT CERVICAL SPINE WITHOUT CONTRAST TECHNIQUE: Multidetector CT imaging of the head and cervical spine was performed following the standard protocol without intravenous contrast. Multiplanar CT image reconstructions of the cervical spine were also generated. COMPARISON:  10/08/2013 FINDINGS: CT HEAD FINDINGS Stable brain atrophy pattern and chronic white matter microvascular ischemic changes throughout the cerebral hemispheres. Stable partially calcified hyperdense extra-axial mass along the right frontal parafalcine region measure approximately 19 x 14 mm compatible with a meningioma. No acute intracranial hemorrhage, acute infarction, focal mass effect, edema, midline shift, herniation, hydrocephalus, or extra-axial fluid collection. Cisterns are patent. Cerebellar atrophy as well. Orbits are symmetric. Atherosclerosis of the intracranial vessels at the skullbase. Skull appears intact. No depressed fracture. Mastoids and sinuses remain clear. CT CERVICAL SPINE FINDINGS Normal cervical spine alignment  without subluxation or dislocation. Slight progression of degenerative disc disease and spondylosis at C2-3 with increased endplate sclerosis and irregularity and osteophyte formation. Similar moderate degenerative changes at C5-6 with anterior overhanging osteophytes. Diffuse multilevel facet arthropathy noted. Odontoid appears intact. Rotation at the C1-2 articulation appears to be positional. No acute displaced fracture evident. Carotid atherosclerosis noted. No soft tissue asymmetry in the neck. Clear lung apices. IMPRESSION: No acute intracranial abnormality or interval change. Stable small right frontal parafalcine Extra-axial mass compatible with a meningioma. Chronic microvascular ischemic changes Diffuse cervical spondylosis and degenerative changes, slight progression since prior study at C2-3. No acute cervical osseous finding or malalignment Electronically Signed   By: Judie Petit.  Shick M.D.   On: 08/03/2015 09:15   Ct Cervical Spine Wo Contrast  08/03/2015  CLINICAL DATA:  Altered mental status, unresponsive, possible unwitnessed trauma EXAM: CT HEAD WITHOUT  CONTRAST CT CERVICAL SPINE WITHOUT CONTRAST TECHNIQUE: Multidetector CT imaging of the head and cervical spine was performed following the standard protocol without intravenous contrast. Multiplanar CT image reconstructions of the cervical spine were also generated. COMPARISON:  10/08/2013 FINDINGS: CT HEAD FINDINGS Stable brain atrophy pattern and chronic white matter microvascular ischemic changes throughout the cerebral hemispheres. Stable partially calcified hyperdense extra-axial mass along the right frontal parafalcine region measure approximately 19 x 14 mm compatible with a meningioma. No acute intracranial hemorrhage, acute infarction, focal mass effect, edema, midline shift, herniation, hydrocephalus, or extra-axial fluid collection. Cisterns are patent. Cerebellar atrophy as well. Orbits are symmetric. Atherosclerosis of the intracranial  vessels at the skullbase. Skull appears intact. No depressed fracture. Mastoids and sinuses remain clear. CT CERVICAL SPINE FINDINGS Normal cervical spine alignment without subluxation or dislocation. Slight progression of degenerative disc disease and spondylosis at C2-3 with increased endplate sclerosis and irregularity and osteophyte formation. Similar moderate degenerative changes at C5-6 with anterior overhanging osteophytes. Diffuse multilevel facet arthropathy noted. Odontoid appears intact. Rotation at the C1-2 articulation appears to be positional. No acute displaced fracture evident. Carotid atherosclerosis noted. No soft tissue asymmetry in the neck. Clear lung apices. IMPRESSION: No acute intracranial abnormality or interval change. Stable small right frontal parafalcine Extra-axial mass compatible with a meningioma. Chronic microvascular ischemic changes Diffuse cervical spondylosis and degenerative changes, slight progression since prior study at C2-3. No acute cervical osseous finding or malalignment Electronically Signed   By: Judie PetitM.  Shick M.D.   On: 08/03/2015 09:15   [place] Venous Img Lower Unilateral Right  08/03/2015  CLINICAL DATA:  82 year old female with right lower extremity swelling for 6 months. EXAM: RIGHT LOWER EXTREMITY VENOUS DOPPLER ULTRASOUND TECHNIQUE: Gray-scale sonography with graded compression, as well as color Doppler and duplex ultrasound were performed to evaluate the lower extremity deep venous systems from the level of the common femoral vein and including the common femoral, femoral, profunda femoral, popliteal and calf veins including the posterior tibial, peroneal and gastrocnemius veins when visible. The superficial great saphenous vein was also interrogated. Spectral Doppler was utilized to evaluate flow at rest and with distal augmentation maneuvers in the common femoral, femoral and popliteal veins. COMPARISON:  None. FINDINGS: Deep venous system appears patent and  compressible from groin through popliteal fossae. Spontaneous venous flow present with evidence of respiratory phasicity. Augmentation intact. No intraluminal thrombus identified. Visualized portions of the greater saphenous veins patent. IMPRESSION: No evidence of deep venous thrombosis. Electronically Signed   By: Harmon PierJeffrey  Hu M.D.   On: 08/03/2015 12:36   Dg Chest Port 1 View  08/03/2015  CLINICAL DATA:  Sepsis. EXAM: PORTABLE CHEST 1 VIEW COMPARISON:  03/01/2011 FINDINGS: Borderline cardiomegaly ectasia of the thoracic aorta are stable. Both lungs are clear. No evidence of pneumothorax or pleural effusion. IMPRESSION: Stable exam.  No active disease. Electronically Signed   By: Myles RosenthalJohn  Stahl M.D.   On: 08/03/2015 08:55    EKG: Atrial fibrillation  ASSESSMENT AND PLAN:   1. Borderline elevated troponin, likely demand supply ischemia, in the absence of chest pain or new ECG changes 2. Chronic atrial fibrillation, rate controlled 3. Urosepsis 4. Baseline dementia  Recommendations  1. Agree with overall current therapy 2. Defer full dose anticoagulation 3. Defer chronic anticoagulation for atrial fibrillation 4. Review 2-D echocardiogram 5. Further recommendations pending echocardiogram results   Signed: Danile Trier MD,PhD, [facility] 08/04/2015, 9:35 AM

## 2015-08-04 NOTE — Progress Notes (Signed)
[facility] [place] at [facility]   PATIENT NAME: Stephanie Willis    MR#:  [id number]  DATE OF BIRTH:  04-07-1922  SUBJECTIVE:  CHIEF COMPLAINT:   Chief Complaint  Patient presents with  . Code Sepsis   Admitted for altered mental status. Has dementia at baseline. Found to have UTI. REVIEW OF SYSTEMS:    Review of Systems  Unable to perform ROS: dementia    DRUG ALLERGIES:   Allergies  Allergen Reactions  . Penicillins     Other reaction(s): Unknown    VITALS:  Blood pressure 142/74, pulse 95, temperature 99.7 F (37.6 C), temperature source Axillary, resp. rate 15, height  (1.575 m), weight 48.2 kg (106 lb 4.2 oz), SpO2 99 %.  PHYSICAL EXAMINATION:   Physical Exam  GENERAL:  82 y.o.-year-old patient lying in the bed with no acute distress.  EYES: Pupils equal, round, reactive to light and accommodation. No scleral icterus. Extraocular muscles intact.  HEENT: Head atraumatic, normocephalic. Oropharynx and nasopharynx clear.  NECK:  Supple, no jugular venous distention. No thyroid enlargement, no tenderness.  LUNGS: Normal breath sounds bilaterally, no wheezing, rales, rhonchi. No use of accessory muscles of respiration.  CARDIOVASCULAR: S1, S2 normal. No murmurs, rubs, or gallops.  ABDOMEN: Soft, nontender, nondistended. Bowel sounds present. No organomegaly or mass.  EXTREMITIES: No cyanosis, clubbing or edema b/l.    NEUROLOGIC: unAble to assess as patient doesn't follow commands. PSYCHIATRIC: The patient is Drowsy but opens eyes on calling her name  LABORATORY PANEL:   CBC  Recent Labs Lab 08/04/15 0251  WBC 8.7  HGB 9.0*  HCT 28.0*  PLT 151   ------------------------------------------------------------------------------------------------------------------ Chemistries   Recent Labs Lab 08/04/15 0251  NA 142  K 4.7  CL 116*  CO2 16*  GLUCOSE 183*  BUN 45*  CREATININE 1.58*  CALCIUM 8.3*  AST 19  ALT 22  ALKPHOS  74  BILITOT 0.8   ------------------------------------------------------------------------------------------------------------------  Cardiac Enzymes  Recent Labs Lab 08/04/15 0251  TROPONINI 0.30*   ------------------------------------------------------------------------------------------------------------------  RADIOLOGY:  Ct Head Wo Contrast  08/03/2015  CLINICAL DATA:  Altered mental status, unresponsive, possible unwitnessed trauma EXAM: CT HEAD WITHOUT CONTRAST CT CERVICAL SPINE WITHOUT CONTRAST TECHNIQUE: Multidetector CT imaging of the head and cervical spine was performed following the standard protocol without intravenous contrast. Multiplanar CT image reconstructions of the cervical spine were also generated. COMPARISON:  10/08/2013 FINDINGS: CT HEAD FINDINGS Stable brain atrophy pattern and chronic white matter microvascular ischemic changes throughout the cerebral hemispheres. Stable partially calcified hyperdense extra-axial mass along the right frontal parafalcine region measure approximately 19 x 14 mm compatible with a meningioma. No acute intracranial hemorrhage, acute infarction, focal mass effect, edema, midline shift, herniation, hydrocephalus, or extra-axial fluid collection. Cisterns are patent. Cerebellar atrophy as well. Orbits are symmetric. Atherosclerosis of the intracranial vessels at the skullbase. Skull appears intact. No depressed fracture. Mastoids and sinuses remain clear. CT CERVICAL SPINE FINDINGS Normal cervical spine alignment without subluxation or dislocation. Slight progression of degenerative disc disease and spondylosis at C2-3 with increased endplate sclerosis and irregularity and osteophyte formation. Similar moderate degenerative changes at C5-6 with anterior overhanging osteophytes. Diffuse multilevel facet arthropathy noted. Odontoid appears intact. Rotation at the C1-2 articulation appears to be positional. No acute displaced fracture evident. Carotid  atherosclerosis noted. No soft tissue asymmetry in the neck. Clear lung apices. IMPRESSION: No acute intracranial abnormality or interval change. Stable small right frontal parafalcine Extra-axial mass compatible with a meningioma. Chronic microvascular  ischemic changes Diffuse cervical spondylosis and degenerative changes, slight progression since prior study at C2-3. No acute cervical osseous finding or malalignment Electronically Signed   By: Judie Petit.  Shick M.D.   On: 08/03/2015 09:15   Ct Cervical Spine Wo Contrast  08/03/2015  CLINICAL DATA:  Altered mental status, unresponsive, possible unwitnessed trauma EXAM: CT HEAD WITHOUT CONTRAST CT CERVICAL SPINE WITHOUT CONTRAST TECHNIQUE: Multidetector CT imaging of the head and cervical spine was performed following the standard protocol without intravenous contrast. Multiplanar CT image reconstructions of the cervical spine were also generated. COMPARISON:  10/08/2013 FINDINGS: CT HEAD FINDINGS Stable brain atrophy pattern and chronic white matter microvascular ischemic changes throughout the cerebral hemispheres. Stable partially calcified hyperdense extra-axial mass along the right frontal parafalcine region measure approximately 19 x 14 mm compatible with a meningioma. No acute intracranial hemorrhage, acute infarction, focal mass effect, edema, midline shift, herniation, hydrocephalus, or extra-axial fluid collection. Cisterns are patent. Cerebellar atrophy as well. Orbits are symmetric. Atherosclerosis of the intracranial vessels at the skullbase. Skull appears intact. No depressed fracture. Mastoids and sinuses remain clear. CT CERVICAL SPINE FINDINGS Normal cervical spine alignment without subluxation or dislocation. Slight progression of degenerative disc disease and spondylosis at C2-3 with increased endplate sclerosis and irregularity and osteophyte formation. Similar moderate degenerative changes at C5-6 with anterior overhanging osteophytes. Diffuse  multilevel facet arthropathy noted. Odontoid appears intact. Rotation at the C1-2 articulation appears to be positional. No acute displaced fracture evident. Carotid atherosclerosis noted. No soft tissue asymmetry in the neck. Clear lung apices. IMPRESSION: No acute intracranial abnormality or interval change. Stable small right frontal parafalcine Extra-axial mass compatible with a meningioma. Chronic microvascular ischemic changes Diffuse cervical spondylosis and degenerative changes, slight progression since prior study at C2-3. No acute cervical osseous finding or malalignment Electronically Signed   By: Judie Petit.  Shick M.D.   On: 08/03/2015 09:15   US Venous Img Lower Unilateral Right  08/03/2015  CLINICAL DATA:  82 year old female with right lower extremity swelling for 6 months. EXAM: RIGHT LOWER EXTREMITY VENOUS DOPPLER ULTRASOUND TECHNIQUE: Gray-scale sonography with graded compression, as well as color Doppler and duplex ultrasound were performed to evaluate the lower extremity deep venous systems from the level of the common femoral vein and including the common femoral, femoral, profunda femoral, popliteal and calf veins including the posterior tibial, peroneal and gastrocnemius veins when visible. The superficial great saphenous vein was also interrogated. Spectral Doppler was utilized to evaluate flow at rest and with distal augmentation maneuvers in the common femoral, femoral and popliteal veins. COMPARISON:  None. FINDINGS: Deep venous system appears patent and compressible from groin through popliteal fossae. Spontaneous venous flow present with evidence of respiratory phasicity. Augmentation intact. No intraluminal thrombus identified. Visualized portions of the greater saphenous veins patent. IMPRESSION: No evidence of deep venous thrombosis. Electronically Signed   By: Harmon Pier M.D.   On: 08/03/2015 12:36   Dg Chest Port 1 View  08/03/2015  CLINICAL DATA:  Sepsis. EXAM: PORTABLE CHEST 1 VIEW  COMPARISON:  03/01/2011 FINDINGS: Borderline cardiomegaly ectasia of the thoracic aorta are stable. Both lungs are clear. No evidence of pneumothorax or pleural effusion. IMPRESSION: Stable exam.  No active disease. Electronically Signed   By: Myles Rosenthal M.D.   On: 08/03/2015 08:55     ASSESSMENT AND PLAN:   * Acute encephalopathy over dementia due to UTI and severe sepsis IV antibiotics Blood and urine cultures pending Nothing by mouth CT scan of the head showed nothing acute  *  Elevated troponin likely due to demand ischemia in setting of history of CAD Stable  * Hypertension add IV Lopressor scheduled. On Nitropaste  * Diabetes mellitus Patient is nothing by mouth. Every 4 hours Accu-Cheks with sliding scale insulin  * DVT prophylaxis with Lovenox  All the records are reviewed and case discussed with Care Management/Social Workerr. Management plans discussed with the patient, family and they are in agreement.  CODE STATUS: FULL CODE  DVT Prophylaxis: SCDs  TOTAL TIME TAKING CARE OF THIS PATIENT: 35 minutes.   POSSIBLE D/C IN 2-3 DAYS, DEPENDING ON CLINICAL CONDITION.  Milagros LollSudini, Keyleen Cerrato R M.D on 08/04/2015 at 9:09 AM  Between 7am to 6pm - Pager - [phone]  After 6pm go to [email or web address] - password [email or web address] [facility]  [facility]  Office  [phone]  CC: Primary care physician; No primary care provider on file.  Note: This dictation was prepared with Dragon dictation along with smaller phrase technology. Any transcriptional errors that result from this process are unintentional.

## 2015-08-04 NOTE — NC FL2 (Signed)
[place] MEDICAID FL2 LEVEL OF CARE SCREENING TOOL     IDENTIFICATION  Patient Name: Stephanie Willis Birthdate: 1921/05/26 Sex: female Admission Date (Current Location): 08/03/2015  [facility] [place]   Facility and Address:  [facility], [address], [place], [place] [address]      Provider Number: [id number]  Attending Physician Name and Address:  Milagros LollSrikar Sudini, MD  Relative Name and Phone Number:       Current Level of Care: SNF Recommended Level of Care: Nursing Facility Prior Approval Number:    Date Approved/Denied:   PASRR Number:    Discharge Plan: SNF    Current Diagnoses: Patient Active Problem List   Diagnosis Date Noted  . Sepsis (HCC) 08/03/2015  . UTI (lower urinary tract infection) 08/03/2015  . Delirium 08/03/2015  . Elevated troponin 08/03/2015    Orientation RESPIRATION BLADDER Height & Weight     Self  Normal Incontinent Weight: 106 lb 4.2 oz (48.2 kg) Height:  5\' 2"  (157.5 cm)  BEHAVIORAL SYMPTOMS/MOOD NEUROLOGICAL BOWEL NUTRITION STATUS      Incontinent Diet (Diabetic)  AMBULATORY STATUS COMMUNICATION OF NEEDS Skin   Extensive Assist Verbally Normal                       Personal Care Assistance Level of Assistance  Bathing, Feeding, Dressing, Total care Bathing Assistance: Maximum assistance Feeding assistance: Maximum assistance Dressing Assistance: Maximum assistance Total Care Assistance: Maximum assistance   Functional Limitations Info  Sight, Hearing, Speech Sight Info: Adequate Hearing Info: Impaired Speech Info: Adequate    SPECIAL CARE FACTORS FREQUENCY                       Contractures Contractures Info: Not present    Additional Factors Info  Allergies   Allergies Info: Penicillins           Current Medications (08/04/2015):  This is the current hospital active medication list Current Facility-Administered Medications  Medication Dose Route Frequency  Provider Last Rate Last Dose  . 0.9 %  sodium chloride infusion   Intravenous Continuous Marguarite ArbourJeffrey D Sparks, MD 100 mL/hr at 08/04/15 0911    . acetaminophen (TYLENOL) tablet 650 mg  650 mg Oral Q6H PRN Marguarite ArbourJeffrey D Sparks, MD       Or  . acetaminophen (TYLENOL) suppository 650 mg  650 mg Rectal Q6H PRN Marguarite ArbourJeffrey D Sparks, MD      . aspirin EC tablet 81 mg  81 mg Oral Daily Marguarite ArbourJeffrey D Sparks, MD   81 mg at 08/03/15 1612  . aztreonam (AZACTAM) 1 g in dextrose 5 % 50 mL IVPB  1 g Intravenous 3 times per day Gayla DossEryka A Gayle, MD   1 g at 08/04/15 0457  . bisacodyl (DULCOLAX) suppository 10 mg  10 mg Rectal Daily PRN Marguarite ArbourJeffrey D Sparks, MD      . enoxaparin (LOVENOX) injection 30 mg  30 mg Subcutaneous Q24H Milagros LollSrikar Sudini, MD   30 mg at 08/04/15 0941  . hydrALAZINE (APRESOLINE) injection 10 mg  10 mg Intravenous Q6H PRN Srikar Sudini, MD      . insulin aspart (novoLOG) injection 0-9 Units  0-9 Units Subcutaneous 6 times per day Milagros LollSrikar Sudini, MD   0 Units at 08/04/15 0915  . LORazepam (ATIVAN) injection 0.5 mg  0.5 mg Intravenous Q1H PRN Marguarite ArbourJeffrey D Sparks, MD      . metoprolol (LOPRESSOR) injection 5 mg  5 mg  Intravenous 3 times per day Milagros Loll, MD   5 mg at 08/04/15 0941  . mirtazapine (REMERON) tablet 7.5 mg  7.5 mg Oral QHS Marguarite Arbour, MD   7.5 mg at 08/03/15 2300  . morphine 2 MG/ML injection 2 mg  2 mg Intravenous Q2H PRN Marguarite Arbour, MD   2 mg at 08/03/15 2354  . nitroGLYCERIN (NITROGLYN) 2 % ointment 2 inch  2 inch Topical 4 times per day Marguarite Arbour, MD   2 inch at 08/04/15 0500  . ondansetron (ZOFRAN) tablet 4 mg  4 mg Oral Q6H PRN Marguarite Arbour, MD       Or  . ondansetron [facility]) injection 4 mg  4 mg Intravenous Q6H PRN Marguarite Arbour, MD      . sodium chloride flush (NS) 0.9 % injection 3 mL  3 mL Intravenous Q12H Marguarite Arbour, MD   3 mL at 08/04/15 0941     Discharge Medications: Please see discharge summary for a list of discharge medications.  Relevant Imaging  Results:  Relevant Lab Results:   Additional Information    Stephanie Willis, [place], [place]

## 2015-08-04 NOTE — Consult Note (Signed)
Pharmacy Antibiotic Note  Stephanie Willis is a 82 y.o. female admitted on 08/03/2015 with sepsis.  Pharmacy has been consulted for aztreonam dosing due to PCN allergy listed in CareEverywhere (unknown reaction).   Plan:  Will continue Azactam 1 g IV q8 hours.   Height: 5\' 2"  (157.5 cm) Weight: 106 lb 4.2 oz (48.2 kg) IBW/kg (Calculated) : 50.1  Temp (24hrs), Avg:100.3 F (37.9 C), Min:98.6 F (37 C), Max:100.9 F (38.3 C)  Estimated Creatinine Clearance: 17.6 mL/min (by C-G formula based on Cr of 1.58).    Allergies  Allergen Reactions  . Penicillins     Other reaction(s): Unknown    Antimicrobials this admission: Aztreonam 4/1 >>  Vancomycin 4/1 >>   Thank you for allowing pharmacy to be a part of this patient's care.  Antonae Zbikowski D 08/04/2015 10:50 AM

## 2015-08-04 NOTE — Progress Notes (Signed)
PT Cancellation Note  Patient Details Name: Stephanie Willis MRN: [id number] DOB: Feb 18, 1922   Cancelled Treatment:    Reason Eval/Treat Not Completed: Patient's level of consciousness Attempted to see pt, she was able to open eyes, but had distant confused affect, was unable to make eye-contact and despite multiple simple yes/no questions did not say anything or seemingly acknowledge PT.  Loran SentersGalen Mahlik Lenn, PT, DPT [phone]  Malachi ProGalen R Ambrielle Kington 08/04/2015, 1:25 PM

## 2015-08-04 NOTE — Progress Notes (Signed)
LCSW spoke to patients brother Alinda MoneyMelvin  [phone] to collect data to complete assessment. [facility] and will [facility] when [facility]. LCSW will complete assessment and Fl2 and will consult with patient once she awakens. Delta Air LinesClaudine Usiel Astarita LCSW [phone]

## 2015-08-04 NOTE — Clinical Social Work Note (Signed)
Clinical Social Work Assessment  Patient Details  Name: Stephanie Willis MRN: [id number] Date of Birth: 1921/07/12  Date of referral:  08/04/15               Reason for consult:  Other (Comment Required), Family Concerns (SNF [place])                Permission sought to share information with:  Facility Medical sales representativeContact Representative, Family Supports Permission granted to share information::  Yes, Verbal Permission Granted  Name::     Stephanie Willis [phone]  Agency::  yes  Relationship::  yes  Contact Information:  yes  Housing/Transportation Living arrangements for the past 2 months:  Skilled Nursing Facility Source of Information:   (Sibling melvin) Patient Interpreter Needed:  None Criminal Activity/Legal Involvement Pertinent to Current Situation/Hospitalization:  No - Comment as needed Significant Relationships:  Siblings Lives with:  Facility Resident Do you feel safe going back to the place where you live?    yes Need for family participation in patient care:  Yes (Comment)  Care giving concerns: Spoke with patients brother and he became concerned last week when she became weak   Office managerocial Worker assessment / plan: LCSW looked in on patient and she was resting, received verbal NOD that I could call her brother to collect some information. Patient is covered by medicare AB and [facility] and can return when she is well. According to brother she required full assistance with her ADL's now and uses a wheel chair but [facility]. She was never married and has no children and resided with him for sometime ( Brother stated that Stephanie Willis raised him herself since he was 726 months old when their mother died, so she is very very dear to him.) She is diabetic and has some dementia but recognizes him and remembers things from long ago but can be forgetful at other times.  Employment status:  Retired Health and safety inspectornsurance information:  Medicare PT Recommendations:  Not assessed at this  time Information / Referral to community resources:  Skilled Nursing Facility  Patient/Family's Response to care:  Family is pleased she is holding her own  Patient/Family's Understanding of and Emotional Response to Diagnosis, Current Treatment, and Prognosis:  They are aware she is septic and doing ok, when she is better [facility]  Emotional Assessment Appearance:  Appears stated age Attitude/Demeanor/Rapport:  Unable to Assess Affect (typically observed):  Calm, Adaptable Orientation:  Oriented to Self, Fluctuating Orientation (Suspected and/or reported Sundowners) Alcohol / Substance use:  Never Used Psych involvement (Current and /or in the community):  No (Comment)  Discharge Needs  Concerns to be addressed:  Cognitive Concerns, Adjustment to Illness Readmission within the last 30 days:  No Current discharge risk:  None Barriers to Discharge:  Continued Medical Work up   [employer]Bandi, Limited BrandsClaudine M, LCSW 08/04/2015, 10:07 AM

## 2015-08-05 DIAGNOSIS — E43 Unspecified severe protein-calorie malnutrition: Secondary | ICD-10-CM | POA: Diagnosis present

## 2015-08-05 LAB — BASIC METABOLIC PANEL
Anion gap: 4 — ABNORMAL LOW (ref 5–15)
BUN: 41 mg/dL — ABNORMAL HIGH (ref 6–20)
CALCIUM: 8.1 mg/dL — AB (ref 8.9–10.3)
CO2: 18 mmol/L — AB (ref 22–32)
CREATININE: 1.38 mg/dL — AB (ref 0.44–1.00)
Chloride: 122 mmol/L — ABNORMAL HIGH (ref 101–111)
GFR, EST AFRICAN AMERICAN: 37 mL/min — AB (ref >60)
GFR, EST NON AFRICAN AMERICAN: 32 mL/min — AB (ref >60)
GLUCOSE: 117 mg/dL — AB (ref 65–99)
Potassium: 3.5 mmol/L (ref 3.5–5.1)
Sodium: 144 mmol/L (ref 135–145)

## 2015-08-05 LAB — CBC WITH DIFFERENTIAL/PLATELET
BASOS PCT: 1 %
Basophils Absolute: 0 10*3/uL (ref 0–0.1)
Eosinophils Absolute: 0.1 10*3/uL (ref 0–0.7)
Eosinophils Relative: 1 %
HEMATOCRIT: 25.9 % — AB (ref 35.0–47.0)
Hemoglobin: 8.4 g/dL — ABNORMAL LOW (ref 12.0–16.0)
LYMPHS ABS: 1 10*3/uL (ref 1.0–3.6)
LYMPHS PCT: 20 %
MCH: 27.3 pg (ref 26.0–34.0)
MCHC: 32.3 g/dL (ref 32.0–36.0)
MCV: 84.6 fL (ref 80.0–100.0)
MONOS PCT: 7 %
Monocytes Absolute: 0.3 10*3/uL (ref 0.2–0.9)
NEUTROS ABS: 3.5 10*3/uL (ref 1.4–6.5)
Neutrophils Relative %: 71 %
PLATELETS: 151 10*3/uL (ref 150–440)
RBC: 3.06 MIL/uL — ABNORMAL LOW (ref 3.80–5.20)
RDW: 17.8 % — AB (ref 11.5–14.5)
WBC: 4.9 10*3/uL (ref 3.6–11.0)

## 2015-08-05 LAB — ECHOCARDIOGRAM COMPLETE
Height: 62 in
Weight: 1700.19 [oz_av]

## 2015-08-05 LAB — GLUCOSE, CAPILLARY
GLUCOSE-CAPILLARY: 103 mg/dL — AB (ref 65–99)
GLUCOSE-CAPILLARY: 90 mg/dL (ref 65–99)
Glucose-Capillary: 112 mg/dL — ABNORMAL HIGH (ref 65–99)
Glucose-Capillary: 115 mg/dL — ABNORMAL HIGH (ref 65–99)
Glucose-Capillary: 160 mg/dL — ABNORMAL HIGH (ref 65–99)
Glucose-Capillary: 94 mg/dL (ref 65–99)

## 2015-08-05 LAB — MAGNESIUM: Magnesium: 2.1 mg/dL (ref 1.7–2.4)

## 2015-08-05 MED ORDER — POTASSIUM CHLORIDE IN NACL 20-0.45 MEQ/L-% IV SOLN
INTRAVENOUS | Status: DC
Start: 2015-08-05 — End: 2015-08-07
  Administered 2015-08-05 – 2015-08-07 (×2): via INTRAVENOUS
  Filled 2015-08-05 (×7): qty 1000

## 2015-08-05 MED ORDER — HYDRALAZINE HCL 50 MG PO TABS
100.0000 mg | ORAL_TABLET | Freq: Three times a day (TID) | ORAL | Status: DC
  Administered 2015-08-05 – 2015-08-06 (×5): 100 mg via ORAL
  Filled 2015-08-05 (×4): qty 2
  Filled 2015-08-05: qty 4
  Filled 2015-08-05: qty 2

## 2015-08-05 MED ORDER — AMLODIPINE BESYLATE 5 MG PO TABS
5.0000 mg | ORAL_TABLET | Freq: Every day | ORAL | Status: DC
  Administered 2015-08-05: 5 mg via ORAL
  Filled 2015-08-05: qty 1

## 2015-08-05 MED ORDER — ENSURE ENLIVE PO LIQD
237.0000 mL | Freq: Two times a day (BID) | ORAL | Status: DC
  Administered 2015-08-07: 237 mL via ORAL

## 2015-08-05 MED ORDER — CLONIDINE HCL 0.1 MG PO TABS
0.1000 mg | ORAL_TABLET | ORAL | Status: AC
  Administered 2015-08-05: 0.1 mg via ORAL
  Filled 2015-08-05: qty 1

## 2015-08-05 MED ORDER — TRAMADOL HCL 50 MG PO TABS
50.0000 mg | ORAL_TABLET | Freq: Four times a day (QID) | ORAL | Status: DC | PRN
  Administered 2015-08-05: 50 mg via ORAL
  Filled 2015-08-05: qty 1

## 2015-08-05 NOTE — Care Management (Signed)
Attempted to screen patient but  a[pparently off the unit for testing.

## 2015-08-05 NOTE — Evaluation (Signed)
Clinical/Bedside Swallow Evaluation Patient Details  Name: Stephanie Willis MRN: [id number] Date of Birth: 09/15/1921  Today's Date: 08/05/2015 Time: SLP Start Time (ACUTE ONLY): 1315 SLP Stop Time (ACUTE ONLY): 1415 SLP Time Calculation (min) (ACUTE ONLY): 60 min  Past Medical History:  Past Medical History  Diagnosis Date  . Stroke (HCC)   . Myocardial infarction (HCC)   . Anemia   . Depression   . Dementia   . Diabetes mellitus without complication (HCC)   . Hypertension   . Chronic kidney disease   . GERD (gastroesophageal reflux disease)    Past Surgical History:  Past Surgical History  Procedure Laterality Date  . Cholecystectomy     HPI:  Pt is a 82 y.o. female has a past medical history significant for CAD s/p MI, Dementia, previous stroke, HTN, DM, and OAB [facility] According to the SNF, the pt is demented and agitated at baseline. Brother states she was well 4 days ago but is now severely confused and agitated. She was brought to the ER today where she was noted to be febrile and tachycardic. UA was grossly abnormal. SNF states she has had a left gaze preference. CT of head and neck in ER unchanged, The pt is unable to provide hx. She is now admitted for presumed urosepsis. Of note, troponin was also elevated ing ER and glucose=315. Currently, pt is awake, mumbled speech mostly w/ confusion. She indicated she wanted something to eat/drink but then stated she was cold. Noted decreased oral awareness for task of taking po's; required max. v erbal/tactile cues.    Assessment / Plan / Recommendation Clinical Impression  Pt appears to present w/ oral phase dysphagia/decreased oral awareness w/ bolus acceptance and management impacted by declined Cognitive status and Dementia; this can increase risk for aspiration in pts. Pt appeared to tolerate trials of thin liquids via straw and trials of puree w/ mod-max. verbal/tactile cues and facilitation during bolus presentation. No  coughing or other overt s/s of aspiration noted during swallowing or clearing of bolus trials; no decline in respiratory status or O2 sats noted w/ trials. Pt required max. cues and feeding assistance sec. to Cognitive decline. Due to pt's current presentation and baseline Cognitive status; rec. a Dys. 1 diet w/ thin liquids w/ strict aspiration precautions and feeding assistance to provide support during oral intake; Dietician f/u for supplements as indicated. Meds in Puree w/ NSG. Feeding assistance. NSG updated.    Aspiration Risk   (reduced following precautions)    Diet Recommendation  Dys. 1 w/ thin liquids; strict aspiration precautions; reduce distractions; feeding assistance  Medication Administration: Crushed with puree    Other  Recommendations Recommended Consults:  (Dietician f/u) Oral Care Recommendations: Oral care BID;Staff/trained caregiver to provide oral care   Follow up Recommendations  Skilled Nursing facility (TBD)    Frequency and Duration min 3x week  2 weeks       Prognosis Prognosis for Safe Diet Advancement: Fair Barriers to Reach Goals: Cognitive deficits;Severity of deficits      Swallow Study   General Date of Onset: 08/03/15 HPI: Pt is a 82 y.o. female has a past medical history significant for CAD s/p MI, Dementia, previous stroke, HTN, DM, and OAB [facility]. According to the SNF, the pt is demented and agitated at baseline. Brother states she was well 4 days ago but is now severely confused and agitated. She was brought to the ER today where she was noted to  be febrile and tachycardic. UA was grossly abnormal. SNF states she has had a left gaze preference. CT of head and neck in ER unchanged, The pt is unable to provide hx. She is now admitted for presumed urosepsis. Of note, troponin was also elevated ing ER and glucose=315. Currently, pt is awake, mumbled speech mostly w/ confusion. She indicated she wanted something to eat/drink but then stated  she was cold. Noted decreased oral awareness for task of taking po's; required max. v erbal/tactile cues.  Type of Study: Bedside Swallow Evaluation Previous Swallow Assessment: none indicated Diet Prior to this Study:  (has been NPO since admission) Temperature Spikes Noted:  (wbc 4.8; temps 96-99) Respiratory Status: Room air History of Recent Intubation: No Behavior/Cognition: Confused;Distractible;Requires cueing;Doesn't follow directions (awake) Oral Cavity Assessment: Dry (limited assessment) Oral Care Completed by SLP: Recent completion by staff Oral Cavity - Dentition: Edentulous Vision:  (n/a) Self-Feeding Abilities: Total assist Patient Positioning: Upright in bed Baseline Vocal Quality: Low vocal intensity (mumbled speech) Volitional Cough: Cognitively unable to elicit Volitional Swallow: Unable to elicit    Oral/Motor/Sensory Function Overall Oral Motor/Sensory Function:  (unable to assess sec. to Cognitive decline)   Ice Chips Ice chips: Within functional limits Presentation: Spoon (fed; 3 trials) Other Comments: decreased awareness for bolus acceptance   Thin Liquid Thin Liquid: Impaired Presentation: Straw (fed; ~4 ozs total) Oral Phase Impairments: Poor awareness of bolus (when accepting bolus; using straw - used pinched straw) Oral Phase Functional Implications:  (groping ) Pharyngeal  Phase Impairments:  (audible swallows) Other Comments: used pinched straw to initiate oral awareness for task    Nectar Thick Nectar Thick Liquid: Not tested   Honey Thick Honey Thick Liquid: Not tested   Puree Puree: Impaired Presentation: Spoon (fed; 4 ozs ) Oral Phase Impairments: Poor awareness of bolus Oral Phase Functional Implications:  (groping) Pharyngeal Phase Impairments:  (none) Other Comments: similar to thin liquid trials   Solid   GO   Solid: Not tested Other Comments: not an appropriate consistency at this time; Cognitive decline and edentulous as well          Stephanie Som, MS, CCC-SLP  Stephanie Willis 08/05/2015,4:18 PM

## 2015-08-05 NOTE — Progress Notes (Signed)
Report called to 2A nurse. Nurse was informed of pt bradycardia, and hypertension. Dr.Sudini ordered Clonidine one time dose. Nurse was notified that Dr.Sudini wanted the Clonidine given, pt transferred to 2A, and to watch pt HR very closely.

## 2015-08-05 NOTE — Progress Notes (Signed)
Pt's brother arrived on the unit this afternoon. Was informed of MRI having questions for him. Attempt was made to contact MRI, no answer.  Pt brother, Alinda MoneyMelvin, left cell phone number to use to contact him during the day since he is at work.  Cell: [phone]. Number added to emergency contact list.

## 2015-08-05 NOTE — Progress Notes (Signed)
Notified Dr. Elpidio AnisSudini pts blood pressure remains elevated systolic bp 170's despite administration of prn and scheduled hydralazine.  Informed md pts heart rate in the 50's therefore metoprolol held.  Dr. Elpidio AnisSudini gave orders to administer 0.1 mg clonidine X1 dose.  Asked md if he wanted pt to remain in ICU due to elevated blood pressure Dr. Elpidio AnisSudini stated he still would like pt to transfer to 2A

## 2015-08-05 NOTE — Progress Notes (Signed)
[facility] [place] [facility]   PATIENT NAME: Stephanie Willis    MR#:  [id number]  DATE OF BIRTH:  1922-03-14  SUBJECTIVE:  CHIEF COMPLAINT:   Chief Complaint  Patient presents with  . Code Sepsis   Admitted for altered mental status. Has dementia at baseline. Found to have UTI. Non verbal and NPO REVIEW OF SYSTEMS:    Review of Systems  Unable to perform ROS: dementia    DRUG ALLERGIES:   Allergies  Allergen Reactions  . Penicillins     Other reaction(s): Unknown    VITALS:  Blood pressure 172/66, pulse 56, temperature 97.3 F (36.3 C), temperature source Core (Comment), resp. rate 12, height  (1.575 m), weight 51.4 kg (113 lb 5.1 oz), SpO2 100 %.  PHYSICAL EXAMINATION:   Physical Exam  GENERAL:  82 y.o.-year-old patient lying in the bed with no acute distress.  EYES: Pupils equal, round, reactive to light and accommodation. No scleral icterus. Extraocular muscles intact.  HEENT: Head atraumatic, normocephalic. Oropharynx and nasopharynx clear.  NECK:  Supple, no jugular venous distention. No thyroid enlargement, no tenderness.  LUNGS: Normal breath sounds bilaterally, no wheezing, rales, rhonchi. No use of accessory muscles of respiration.  CARDIOVASCULAR: S1, S2 normal. No murmurs, rubs, or gallops.  ABDOMEN: Soft, nontender, nondistended. Bowel sounds present. No organomegaly or mass.  EXTREMITIES: No cyanosis, clubbing or edema b/l.    NEUROLOGIC: unable to assess as patient doesn't follow commands. No flaccidity PSYCHIATRIC: The patient is Drowsy but opens eyes on calling her name  LABORATORY PANEL:   CBC  Recent Labs Lab 08/05/15 0751  WBC 4.9  HGB 8.4*  HCT 25.9*  PLT 151   ------------------------------------------------------------------------------------------------------------------ Chemistries   Recent Labs Lab 08/04/15 0251 08/05/15 0751  NA 142 144  K 4.7 3.5  CL 116* 122*  CO2 16* 18*  GLUCOSE  183* 117*  BUN 45* 41*  CREATININE 1.58* 1.38*  CALCIUM 8.3* 8.1*  MG  --  2.1  AST 19  --   ALT 22  --   ALKPHOS 74  --   BILITOT 0.8  --    ------------------------------------------------------------------------------------------------------------------  Cardiac Enzymes  Recent Labs Lab 08/04/15 0251  TROPONINI 0.30*   ------------------------------------------------------------------------------------------------------------------  RADIOLOGY:  US Venous Img Lower Unilateral Right  08/03/2015  CLINICAL DATA:  82 year old female with right lower extremity swelling for 6 months. EXAM: RIGHT LOWER EXTREMITY VENOUS DOPPLER ULTRASOUND TECHNIQUE: Gray-scale sonography with graded compression, as well as color Doppler and duplex ultrasound were performed to evaluate the lower extremity deep venous systems from the level of the common femoral vein and including the common femoral, femoral, profunda femoral, popliteal and calf veins including the posterior tibial, peroneal and gastrocnemius veins when visible. The superficial great saphenous vein was also interrogated. Spectral Doppler was utilized to evaluate flow at rest and with distal augmentation maneuvers in the common femoral, femoral and popliteal veins. COMPARISON:  None. FINDINGS: Deep venous system appears patent and compressible from groin through popliteal fossae. Spontaneous venous flow present with evidence of respiratory phasicity. Augmentation intact. No intraluminal thrombus identified. Visualized portions of the greater saphenous veins patent. IMPRESSION: No evidence of deep venous thrombosis. Electronically Signed   By: Harmon Pier M.D.   On: 08/03/2015 12:36     ASSESSMENT AND PLAN:   * Acute encephalopathy over dementia due to UTI and severe sepsis IV antibiotics Blood and urine cultures pending Nothing by mouth CT scan of the head showed nothing acute.  Check MRI for CVA. She does have h/o CVA Palliative care  consulted for goals of care  * Elevated troponin likely due to demand ischemia in setting of history of CAD Stable Appreciate cardiology input  * Hypertension added IV Lopressor scheduled. On Nitropaste  * Diabetes mellitus Patient is nothing by mouth. Every 4 hours Accu-Cheks with sliding scale insulin  * DVT prophylaxis with Lovenox  All the records are reviewed and case discussed with Care Management/Social Workerr. Management plans discussed with the patient, family and they are in agreement.  CODE STATUS: FULL CODE  DVT Prophylaxis: SCDs  TOTAL TIME TAKING CARE OF THIS PATIENT: 35 minutes.   POSSIBLE D/C IN 2-3 DAYS, DEPENDING ON CLINICAL CONDITION.  Milagros LollSudini, [place] R M.D on 08/05/2015 at 9:48 AM  Between 7am to 6pm - Pager - [phone]  After 6pm go to [email or web address] - password [email or web address] [facility]  [place]  Office  [phone]  CC: Primary care physician; No primary care provider on file.  Note: This dictation was prepared with Dragon dictation along with smaller phrase technology. Any transcriptional errors that result from this process are unintentional.

## 2015-08-05 NOTE — Evaluation (Signed)
Physical Therapy Evaluation Patient Details Name: Stephanie Willis MRN: [id number] DOB: 10/25/1921 Today's Date: 08/05/2015   History of Present Illness  Pt is a 82 y.o. F admitted to hospital for sepsis, UTI and elevated troponin. Pt presented with decreased responsiveness compared to baseline. Pt has hx of severe dementia, DM, HTN, hyperlipidemia, CVA, CAD w/previous MI. Pt currently resides in [facility].  Clinical Impression  Pt is a 82 y.o. F admitted to hospital for sepsis, UTI, and elevated troponin. Pt has hx of severe dementia, DM, HTN, hyperlipidemia, CVA, CAD w/previous MI. Prior to hospitalization, pt resided at [facility] care SNF. Pt used WC for mobility with assist to push. Pt required assist with all ADLs. Pt has no children or spouse. Pts brother involved in her care, however not present for therapy session. Pt awake and able to communicate during session, however pt confused and demonstrated difficulty following simple commands. Pt demonstrated poor strength in B UE and B LE, with greater deficits on the R LE than L. Pt stated experiencing pain in R LE.  Pt able to perform supine there-ex with mod to no assist, requiring heavy cues on correct form and speed. Pt able to perform bed mobility with 2+ max assist. Once sitting, pt able to maintain sitting balance with CGA. Pt often distracted from therapy by hunger and feeling cold. No family member present to confirm if pt currently at baseline cognitive level. Pt demonstrates deficits in strength, mobility, and balance. Pt would benefit from further skilled therapy to address deficits; recommend pt return to SNF after discharge from acute hospitalization.     Follow Up Recommendations SNF    Equipment Recommendations       Recommendations for Other Services       Precautions / Restrictions Precautions Precautions: Fall Restrictions Weight Bearing Restrictions: No      Mobility  Bed Mobility Overal bed mobility: +2 for physical  assistance;Needs Assistance Bed Mobility: Supine to Sit     Supine to sit: Max assist;+2 for physical assistance     General bed mobility comments: Pt able to perform bed mobility to EOB with 2+ max assist. Assist required to move trunk/LE. Once supine in bed, needed assist for positioning.   Transfers                 General transfer comment: Did not perform transfer due to safety reasons.   Ambulation/Gait             General Gait Details: Unable to perform ambulation due to pts level of function.   Stairs            Wheelchair Mobility    Modified Rankin (Stroke Patients Only)       Balance Overall balance assessment: Needs assistance Sitting-balance support: Bilateral upper extremity supported Sitting balance-Leahy Scale: Fair Sitting balance - Comments: Pt able to maintain sitting balance on EOB w/ B LE off of ground with CGA.  Postural control: Other (comment) (Anterior lean)     Standing balance comment: No assessed for safety reasons.                             Pertinent Vitals/Pain Pain Assessment: Faces Faces Pain Scale: Hurts even more Pain Location: Pt stated R LE hurting her Pain Intervention(s): Limited activity within patient's tolerance    Home Living Family/patient expects to be discharged to:: Skilled nursing facility  Additional Comments: [facility] to hospitalization[facility] Pt has no children or spouse. Her brother is available near by.     Prior Function Level of Independence: Needs assistance   Gait / Transfers Assistance Needed: Pt ambulates in WC, needs to be pushed   ADL's / Homemaking Assistance Needed: Pt requires assist with ADLs        Hand Dominance        Extremity/Trunk Assessment   Upper Extremity Assessment: Generalized weakness;RUE deficits/detail;LUE deficits/detail RUE Deficits / Details: R grip strength and UE grossly 3/5     LUE Deficits / Details: L grip  strength and UE grossly 3/5   Lower Extremity Assessment: Generalized weakness;RLE deficits/detail;LLE deficits/detail (R LE weaker than L LE) RLE Deficits / Details: grossly 2+/5 LLE Deficits / Details: grossly 3+/5     Communication   Communication: Other (comment) (mumbled speech, confusion )  Cognition Arousal/Alertness: Lethargic Behavior During Therapy: Agitated Overall Cognitive Status: History of cognitive impairments - at baseline Area of Impairment: Attention;Memory;Following commands   Current Attention Level: Alternating   Following Commands: Follows one step commands inconsistently       General Comments: Pt has hx of dementia. No family member present to confirm if this was baseline. Pt demonstrated difficulty following commands at times. Pt distracted from therapy by hunger/temperature.     General Comments General comments (skin integrity, edema, etc.): Edema noted on R LE around ankle    Exercises Other Exercises Other Exercises: Pt able to perform supine ther-ex including ankle pumps on B LE w/no assist SAQ and SLR on L LE w/mod assist. Pt unable to follow commands well for ther-ex. Pt given heavy cues on proper speed and form of ther-ex, however did not demonstrate understanding. Ankle pumps performed approx x15 on B LE, SLR and SAQ performed approx. x10 on L LE.      Assessment/Plan    PT Assessment Patient needs continued PT services  PT Diagnosis Generalized weakness;Altered mental status;Difficulty walking   PT Problem List Decreased strength;Decreased balance;Decreased mobility;Decreased coordination;Decreased cognition  PT Treatment Interventions Therapeutic exercise;Therapeutic activities;Balance training   PT Goals (Current goals can be found in the Care Plan section) Acute Rehab PT Goals Patient Stated Goal: To return to PLOF PT Goal Formulation: Patient unable to participate in goal setting Time For Goal Achievement: 08/19/15 Potential to  Achieve Goals: Good    Frequency Min 2X/week   Barriers to discharge        Co-evaluation               End of Session   Activity Tolerance: Patient limited by lethargy Patient left: in bed;with bed alarm set;with nursing/sitter in room           Time: 1152-1209 PT Time Calculation (min) (ACUTE ONLY): 17 min   Charges:         PT G Codes:        Dorita FrayMartha Annaleigha Woo 08/05/2015, 1:49 PM M. Hettie Holsteinlaire Seriah Brotzman, SPT

## 2015-08-05 NOTE — Progress Notes (Signed)
Initial Nutrition Assessment  DOCUMENTATION CODES:   Severe malnutrition in context of chronic illness  INTERVENTION:   Medical Food Supplement Therapy: Mining engineerMagic Cup at PPL CorporationLunch and Futures traderDinner; Hewlett[employer]ardEnsure Enlive po BID with meals, each supplement provides 350 kcal and 20 grams of protein Feeding Assistance: assist and encourage pt at all meal times, re-direction as needed  NUTRITION DIAGNOSIS:   Malnutrition related to chronic illness as evidenced by severe depletion of body fat, severe depletion of muscle mass.  GOAL:   Patient will meet greater than or equal to 90% of their needs  MONITOR:   PO intake, Supplement acceptance, Weight trends, Skin  REASON FOR ASSESSMENT:   Malnutrition Screening Tool    ASSESSMENT:   82 yo female admitted with acute encephalopathy due to UTI and severe sepsis, pt with dementia at baseline.   Pt alert on visit today, confused, not answering questions appropriately  Past Medical History  Diagnosis Date  . Stroke (HCC)   . Myocardial infarction (HCC)   . Anemia   . Depression   . Dementia   . Diabetes mellitus without complication (HCC)   . Hypertension   . Chronic kidney disease   . GERD (gastroesophageal reflux disease)    Diet Order:  DIET - DYS 1 Room service appropriate?: Yes with Assist; Fluid consistency:: Thin  Energy Intake: pt has not had a meal tray as diet just advanced post SLP evaluation today but pt did eat ice cream and applesauce with SLP  Food and Nutrition Related History: unable to assess  Skin:  Reviewed, no issues  Last BM:  08/03/15   Nutrition Focused Physical Exam: Nutrition-Focused physical exam completed. Findings are mild to severe fat depletion,  Moderate to severe muscle depletion, and mild edema.   Height:   Ht Readings from Last 1 Encounters:  08/03/15 5\' 2"  (1.575 m)    Weight:   Wt Readings from Last 1 Encounters:  08/05/15 113 lb 5.1 oz (51.4 kg)    Filed Weights   08/03/15 0800 08/03/15 1509  08/05/15 0413  Weight: 170 lb (77.111 kg) 106 lb 4.2 oz (48.2 kg) 113 lb 5.1 oz (51.4 kg)    BMI:  Body mass index is 20.72 kg/(m^2).  Estimated Nutritional Needs:   Kcal:  1400-1500 kcals   Protein:  51-61 g   Fluid:  >1.5 L  EDUCATION NEEDS:   Education needs no appropriate at this time  Romelle StarcherCate Makenah Karas MS, RD, LDN [phone] Pager  [phone] Weekend/On-Call Pager

## 2015-08-05 NOTE — Progress Notes (Signed)
Dr.Sudini paged regarding pt's uncontrolled hypertension. Orders given PO hydralazine 100 mg. Pt home dose 50 mg bid. Dr. Elpidio AnisSudini acknowledged concern. Orders to keep Hydralazine 100 mg and to give 10 mg IV now as well for pt's BP 220/12.  Will continue to assess.

## 2015-08-06 ENCOUNTER — Inpatient Hospital Stay: Payer: Medicare Other

## 2015-08-06 LAB — GLUCOSE, CAPILLARY
GLUCOSE-CAPILLARY: 172 mg/dL — AB (ref 65–99)
Glucose-Capillary: 76 mg/dL (ref 65–99)
Glucose-Capillary: 99 mg/dL (ref 65–99)
Glucose-Capillary: 119 mg/dL — ABNORMAL HIGH (ref 65–99)
Glucose-Capillary: 125 mg/dL — ABNORMAL HIGH (ref 65–99)

## 2015-08-06 MED ORDER — METOPROLOL TARTRATE 25 MG PO TABS
12.5000 mg | ORAL_TABLET | Freq: Two times a day (BID) | ORAL | Status: DC
  Administered 2015-08-06 – 2015-08-07 (×3): 12.5 mg via ORAL
  Filled 2015-08-06 (×3): qty 1

## 2015-08-06 MED ORDER — INSULIN ASPART 100 UNIT/ML ~~LOC~~ SOLN
0.0000 [IU] | Freq: Three times a day (TID) | SUBCUTANEOUS | Status: DC
  Administered 2015-08-06 – 2015-08-07 (×2): 1 [IU] via SUBCUTANEOUS
  Filled 2015-08-06: qty 1

## 2015-08-06 MED ORDER — DONEPEZIL HCL 5 MG PO TABS
5.0000 mg | ORAL_TABLET | Freq: Every day | ORAL | Status: DC
  Administered 2015-08-06: 5 mg via ORAL
  Filled 2015-08-06: qty 1

## 2015-08-06 MED ORDER — PANTOPRAZOLE SODIUM 40 MG PO TBEC
40.0000 mg | DELAYED_RELEASE_TABLET | Freq: Every day | ORAL | Status: DC
  Administered 2015-08-06 – 2015-08-07 (×2): 40 mg via ORAL
  Filled 2015-08-06 (×2): qty 1

## 2015-08-06 MED ORDER — TAMSULOSIN HCL 0.4 MG PO CAPS
0.4000 mg | ORAL_CAPSULE | Freq: Every day | ORAL | Status: DC
  Administered 2015-08-06 – 2015-08-07 (×2): 0.4 mg via ORAL
  Filled 2015-08-06 (×2): qty 1

## 2015-08-06 MED ORDER — AMLODIPINE BESYLATE 10 MG PO TABS
10.0000 mg | ORAL_TABLET | Freq: Every day | ORAL | Status: DC
  Administered 2015-08-06 – 2015-08-07 (×2): 10 mg via ORAL
  Filled 2015-08-06 (×2): qty 1

## 2015-08-06 MED ORDER — LORAZEPAM 2 MG/ML IJ SOLN
1.0000 mg | Freq: Once | INTRAMUSCULAR | Status: AC
  Administered 2015-08-06: 1 mg via INTRAVENOUS
  Filled 2015-08-06: qty 1

## 2015-08-06 NOTE — Progress Notes (Signed)
Pt continues to be confused and agitated, talking to self, noted decrease in agitation since ativan, will continue to assess for safety and toileting needs

## 2015-08-06 NOTE — Progress Notes (Signed)
Attempted to perform MRI on pt, given a total of 1.5mg  of ativan, MRI called theres is no change in pts activity level, will notify MD

## 2015-08-06 NOTE — Care Management Important Message (Signed)
Important Message  Patient Details  Name: Stephanie Willis MRN: [id number] Date of Birth: 08-30-1921   Medicare Important Message Given:  Yes    Olegario MessierKathy A Maile Linford 08/06/2015, 10:09 AM

## 2015-08-06 NOTE — Care Management (Signed)
Palliative care consult  is pending for goals of treatment.  patient admitted for severe sepsis from  [facility].  CSW is involved

## 2015-08-06 NOTE — Progress Notes (Signed)
[facility] [place] [facility]   PATIENT NAME: Stephanie Willis    MR#:  [id number]  DATE OF BIRTH:  [birth year]  SUBJECTIVE:  CHIEF COMPLAINT:   Chief Complaint  Patient presents with  . Code Sepsis   Admitted for altered mental status. Has dementia at baseline. Found to have UTI. More awake and confused. REVIEW OF SYSTEMS:    Review of Systems  Unable to perform ROS: dementia    DRUG ALLERGIES:   Allergies  Allergen Reactions  . Penicillins     Other reaction(s): Unknown    VITALS:  Blood pressure 164/67, pulse 55, temperature 97.8 F (36.6 C), temperature source Oral, resp. rate 18, height 5\' 2"  (1.575 m), weight 51.211 kg (112 lb 14.4 oz), SpO2 100 %.  PHYSICAL EXAMINATION:   Physical Exam  GENERAL:  82 y.o.-year-old patient lying in the bed with no acute distress.  EYES: Pupils equal, round, reactive to light and accommodation. No scleral icterus. Extraocular muscles intact.  HEENT: Head atraumatic, normocephalic. Oropharynx and nasopharynx clear.  NECK:  Supple, no jugular venous distention. No thyroid enlargement, no tenderness.  LUNGS: Normal breath sounds bilaterally, no wheezing, rales, rhonchi. No use of accessory muscles of respiration.  CARDIOVASCULAR: S1, S2 normal. No murmurs, rubs, or gallops.  ABDOMEN: Soft, nontender, nondistended. Bowel sounds present. No organomegaly or mass.  EXTREMITIES: No cyanosis, clubbing or edema b/l.    NEUROLOGIC: Moves all 4 extremities spontaneously. Not following commands. No flaccidity or rigidity noticed. PSYCHIATRIC: The patient is Awake  LABORATORY PANEL:   CBC  Recent Labs Lab 08/05/15 0751  WBC 4.9  HGB 8.4*  HCT 25.9*  PLT 151   ------------------------------------------------------------------------------------------------------------------ Chemistries   Recent Labs Lab 08/04/15 0251 08/05/15 0751  NA 142 144  K 4.7 3.5  CL 116* 122*  CO2 16* 18*  GLUCOSE 183* 117*   BUN 45* 41*  CREATININE 1.58* 1.38*  CALCIUM 8.3* 8.1*  MG  --  2.1  AST 19  --   ALT 22  --   ALKPHOS 74  --   BILITOT 0.8  --    ------------------------------------------------------------------------------------------------------------------  Cardiac Enzymes  Recent Labs Lab 08/04/15 0251  TROPONINI 0.30*   ------------------------------------------------------------------------------------------------------------------  RADIOLOGY:  No results found.   ASSESSMENT AND PLAN:   * Acute encephalopathy over dementia due to UTI and severe sepsis - improving IV antibiotics Blood and urine cultures pending CT scan of the head showed nothing acute. Check MRI for CVA. She does have h/o CVA Palliative care consulted for goals of care  * Elevated troponin likely due to demand ischemia in setting of history of CAD Stable Appreciate cardiology input  * Hypertension Still uncontrolled. Increased home dose of hydralazine and added Norvasc and metoprolol.  * Diabetes mellitus Sliding scale insulin.  * DVT prophylaxis with Lovenox  All the records are reviewed and case discussed with Care Management/Social Workerr. Management plans discussed with the patient, family and they are in agreement.  CODE STATUS: FULL CODE  DVT Prophylaxis: SCDs  TOTAL TIME TAKING CARE OF THIS PATIENT: 35 minutes.   POSSIBLE D/C IN 2-3 DAYS, DEPENDING ON CLINICAL CONDITION.  Milagros LollSudini, Devanta Daniel R M.D on 08/06/2015 at 11:24 AM  Between 7am to 6pm - Pager - [phone]  After 6pm go to [email or web address] - password [email or web address] [facility]  [facility]  Office  [phone]  CC: Primary care physician; No primary care provider on file.  Note: This dictation was prepared with Dragon dictation along with smaller  Company secretary. Any transcriptional errors that result from this process are unintentional.

## 2015-08-07 LAB — CBC
HCT: 29.5 % — ABNORMAL LOW (ref 35.0–47.0)
Hemoglobin: 9.8 g/dL — ABNORMAL LOW (ref 12.0–16.0)
MCH: 27.2 pg (ref 26.0–34.0)
MCHC: 33.2 g/dL (ref 32.0–36.0)
MCV: 81.9 fL (ref 80.0–100.0)
PLATELETS: 148 10*3/uL — AB (ref 150–440)
RBC: 3.6 MIL/uL — ABNORMAL LOW (ref 3.80–5.20)
RDW: 16.6 % — AB (ref 11.5–14.5)
WBC: 3.7 10*3/uL (ref 3.6–11.0)

## 2015-08-07 LAB — BASIC METABOLIC PANEL
Anion gap: 6 (ref 5–15)
BUN: 32 mg/dL — AB (ref 6–20)
CO2: 16 mmol/L — ABNORMAL LOW (ref 22–32)
CREATININE: 0.89 mg/dL (ref 0.44–1.00)
Calcium: 8 mg/dL — ABNORMAL LOW (ref 8.9–10.3)
Chloride: 120 mmol/L — ABNORMAL HIGH (ref 101–111)
GFR calc Af Amer: >60 mL/min (ref >60)
GFR, EST NON AFRICAN AMERICAN: 55 mL/min — AB (ref >60)
Glucose, Bld: 130 mg/dL — ABNORMAL HIGH (ref 65–99)
Potassium: 4.1 mmol/L (ref 3.5–5.1)
SODIUM: 142 mmol/L (ref 135–145)

## 2015-08-07 LAB — GLUCOSE, CAPILLARY
GLUCOSE-CAPILLARY: 87 mg/dL (ref 65–99)
Glucose-Capillary: 140 mg/dL — ABNORMAL HIGH (ref 65–99)

## 2015-08-07 MED ORDER — LORAZEPAM 0.5 MG PO TABS: 0.5000 mg | ORAL_TABLET | Freq: Two times a day (BID) | ORAL | Status: DC | PRN

## 2015-08-07 MED ORDER — AMLODIPINE BESYLATE 10 MG PO TABS: 10.0000 mg | ORAL_TABLET | Freq: Every day | ORAL | Status: AC

## 2015-08-07 MED ORDER — METOPROLOL TARTRATE 25 MG PO TABS: 12.5000 mg | ORAL_TABLET | Freq: Two times a day (BID) | ORAL | Status: AC

## 2015-08-07 MED ORDER — CIPROFLOXACIN HCL 250 MG PO TABS: 250.0000 mg | ORAL_TABLET | Freq: Two times a day (BID) | ORAL | Status: DC

## 2015-08-07 MED ORDER — HYDRALAZINE HCL 100 MG PO TABS: 100.0000 mg | ORAL_TABLET | Freq: Three times a day (TID) | ORAL | Status: AC

## 2015-08-07 NOTE — Discharge Instructions (Signed)
°  DIET:  °Cardiac diet ° °DISCHARGE CONDITION:  °Stable ° °ACTIVITY:  °Activity as tolerated ° °OXYGEN:  °Home Oxygen: No. °  °Oxygen Delivery: room air ° °DISCHARGE LOCATION:  °nursing home  ° °If you experience worsening of your admission symptoms, develop shortness of breath, life threatening emergency, suicidal or homicidal thoughts you must seek medical attention immediately by calling 911 or calling your MD immediately  if symptoms less severe. ° °You Must read complete instructions/literature along with all the possible adverse reactions/side effects for all the Medicines you take and that have been prescribed to you. Take any new Medicines after you have completely understood and accpet all the possible adverse reactions/side effects.  ° °Please note ° °You were cared for by a hospitalist during your hospital stay. If you have any questions about your discharge medications or the care you received while you were in the hospital after you are discharged, you can call the unit and asked to speak with the hospitalist on call if the hospitalist that took care of you is not available. Once you are discharged, your primary care physician will handle any further medical issues. Please note that NO REFILLS for any discharge medications will be authorized once you are discharged, as it is imperative that you return to your primary care physician (or establish a relationship with a primary care physician if you do not have one) for your aftercare needs so that they can reassess your need for medications and monitor your lab values. ° ° ° °

## 2015-08-07 NOTE — Progress Notes (Signed)
Clinical Social Worker was informed that [facility] Patient and her brother- Alinda MoneyMelvin are in a agreement with plan. CS[facility]. Provided patient's room number 87 and number to call for report [phone]. [facility].    RN will call report and [facility]  Woodroe Modehristina Gaige Sebo, MSW, LCSW-A Clinical Social Work Department [phone]

## 2015-08-07 NOTE — [place] FL2 (Signed)
[place] MEDICAID FL2 LEVEL OF CARE SCREENING TOOL     IDENTIFICATION  Patient Name: Stephanie Willis Birthdate: 12-22-1921 Sex: female Admission Date (Current Location): 08/03/2015  [place] and [place] Number:  Chiropodist and Address:  [facility], [address] [place], [place] [address]      Provider Number: [id number]  Attending Physician Name and Address:  Milagros Loll, MD  Relative Name and Phone Number:       Current Level of Care: Hospital Recommended Level of Care: Skilled Nursing Facility Prior Approval Number:    Date Approved/Denied:   PASRR Number:  [id number])  Discharge Plan: SNF    Current Diagnoses: Patient Active Problem List   Diagnosis Date Noted  . Protein-calorie malnutrition, severe 08/05/2015  . Sepsis (HCC) 08/03/2015  . UTI (lower urinary tract infection) 08/03/2015  . Delirium 08/03/2015  . Elevated troponin 08/03/2015    Orientation RESPIRATION BLADDER Height & Weight     Self  Normal Continent Weight: 112 lb 4.8 oz (50.939 kg) Height:   (157.5 cm)  BEHAVIORAL SYMPTOMS/MOOD NEUROLOGICAL BOWEL NUTRITION STATUS   (None)  (None) Continent Diet (DYS 1)  AMBULATORY STATUS COMMUNICATION OF NEEDS Skin   Extensive Assist Verbally Normal                       Personal Care Assistance Level of Assistance  Bathing, Feeding, Dressing Bathing Assistance: Limited assistance Feeding assistance: Independent Dressing Assistance: Limited assistance Total Care Assistance: Maximum assistance   Functional Limitations Info  Sight, Hearing, Speech Sight Info: Impaired Hearing Info: Adequate Speech Info: Adequate    SPECIAL CARE FACTORS FREQUENCY  PT (By licensed PT)     PT Frequency:  (5)              Contractures Contractures Info: Not present    Additional Factors Info  Insulin Sliding Scale, Allergies, Code Status Code Status Info:  (Full Code) Allergies Info:  (Penicillins)    Insulin Sliding Scale Info:  (insulin aspart (novoLOG) injection 0-9 Units 0-9 Units, Subcutaneous, 3 times daily with meals  )       Current Medications (08/07/2015):  This is the current hospital active medication list Current Facility-Administered Medications  Medication Dose Route Frequency Provider Last Rate Last Dose  . 0.45 % NaCl with KCl 20 mEq / L infusion   Intravenous Continuous Milagros Loll, MD 50 mL/hr at 08/05/15 2148    . acetaminophen (TYLENOL) tablet 650 mg  650 mg Oral Q6H PRN Marguarite Arbour, MD       Or  . acetaminophen (TYLENOL) suppository 650 mg  650 mg Rectal Q6H PRN Marguarite Arbour, MD      . amLODipine (NORVASC) tablet 10 mg  10 mg Oral Daily Milagros Loll, MD   10 mg at 08/07/15 5621  . aspirin EC tablet 81 mg  81 mg Oral Daily Marguarite Arbour, MD   81 mg at 08/07/15 3086  . aztreonam (AZACTAM) 1 g in dextrose 5 % 50 mL IVPB  1 g Intravenous 3 times per day Gayla Doss, MD   1 g at 08/07/15 0520  . bisacodyl (DULCOLAX) suppository 10 mg  10 mg Rectal Daily PRN Marguarite Arbour, MD      . donepezil (ARICEPT) tablet 5 mg  5 mg Oral QHS Milagros Loll, MD   5 mg at 08/06/15 2046  . enoxaparin (LOVENOX) injection 30 mg  30 mg Subcutaneous Q24H Srikar Sudini,  MD   30 mg at 08/07/15 0832  . feeding supplement (ENSURE ENLIVE) (ENSURE ENLIVE) liquid 237 mL  237 mL Oral BID WC Srikar Sudini, MD   237 mL at 08/07/15 0800  . hydrALAZINE (APRESOLINE) injection 10 mg  10 mg Intravenous Q6H PRN Milagros LollSrikar Sudini, MD   10 mg at 08/07/15 0515  . hydrALAZINE (APRESOLINE) tablet 100 mg  100 mg Oral 3 times per day Milagros LollSrikar Sudini, MD   100 mg at 08/06/15 2045  . insulin aspart (novoLOG) injection 0-9 Units  0-9 Units Subcutaneous TID WC Milagros LollSrikar Sudini, MD   1 Units at 08/07/15 [id number]  . LORazepam (ATIVAN) injection 0.5 mg  0.5 mg Intravenous Q1H PRN Marguarite ArbourJeffrey D Sparks, MD   0.5 mg at 08/07/15 0515  . metoprolol tartrate (LOPRESSOR) tablet 12.5 mg  12.5 mg Oral BID Milagros LollSrikar Sudini, MD   12.5  mg at 08/07/15 0833  . mirtazapine (REMERON) tablet 7.5 mg  7.5 mg Oral QHS Marguarite ArbourJeffrey D Sparks, MD   7.5 mg at 08/06/15 2046  . ondansetron (ZOFRAN) tablet 4 mg  4 mg Oral Q6H PRN Marguarite ArbourJeffrey D Sparks, MD       Or  . ondansetron [facility](ZOFRAN) injection 4 mg  4 mg Intravenous Q6H PRN Marguarite ArbourJeffrey D Sparks, MD      . pantoprazole (PROTONIX) EC tablet 40 mg  40 mg Oral Daily Milagros LollSrikar Sudini, MD   40 mg at 08/07/15 [id number]  . sodium chloride flush (NS) 0.9 % injection 3 mL  3 mL Intravenous Q12H Marguarite ArbourJeffrey D Sparks, MD   3 mL at 08/06/15 2051  . tamsulosin (FLOMAX) capsule 0.4 mg  0.4 mg Oral Daily Milagros LollSrikar Sudini, MD   0.4 mg at 08/07/15 0833  . traMADol (ULTRAM) tablet 50 mg  50 mg Oral Q6H PRN Milagros LollSrikar Sudini, MD   50 mg at 08/05/15 1458     Discharge Medications: Please see discharge summary for a list of discharge medications.  Relevant Imaging Results:  Relevant Lab Results:   Additional Information  (SSN [id number])  Verta Ellenhristina E Eulene Pekar, LCSW

## 2015-08-07 NOTE — Progress Notes (Signed)
Patient discharged via EMS and Stretcher. IV removed and catheter intact. All discharge instructions given and patient verbalizes understanding. Tele removed and returned. Prescriptions placed in folder for [place] house. No distress noted.

## 2015-08-07 NOTE — Discharge Summary (Signed)
[facility] - [place] at [facility]   PATIENT NAME: Stephanie Willis    MR#:  [id number]  DATE OF BIRTH:  [birth year]  DATE OF ADMISSION:  08/03/2015 ADMITTING PHYSICIAN: Marguarite Arbour, MD  DATE OF DISCHARGE: No discharge date for patient encounter.  PRIMARY CARE PHYSICIAN: No primary care provider on file.   ADMISSION DIAGNOSIS:  Sepsis secondary to UTI (HCC) [A41.9, N39.0] Leg edema, right [R60.0]  DISCHARGE DIAGNOSIS:  Principal Problem:   Sepsis (HCC) Active Problems:   UTI (lower urinary tract infection)   Delirium   Elevated troponin   Protein-calorie malnutrition, severe   SECONDARY DIAGNOSIS:   Past Medical History  Diagnosis Date  . Stroke (HCC)   . Myocardial infarction (HCC)   . Anemia   . Depression   . Dementia   . Diabetes mellitus without complication (HCC)   . Hypertension   . Chronic kidney disease   . GERD (gastroesophageal reflux disease)      ADMITTING HISTORY  HPI: Stephanie Willis is a 82 y.o. female has a past medical history significant for CAD s/p MI, Dementia, previous stroke, HTN, DM, and OAB who resides at [facility]. According to the SNF, the pt is demented and agitated at baseline. Brother states she was well 4 days ago but is now severely confused and agitated. She was brought to the ER today where she was noted to be febrile and tachycardic. UA was grossly abnormal. SNF states she has had a left gaze preference. CT of head and neck in ER unchanged, The pt is unable to provide hx. She is now admitted for presumed urosepsis. Of note, troponin was also elevated ing ER and glucose=315.  HOSPITAL COURSE:   * Acute encephalopathy over dementia due to UTI and severe sepsis - Resolved IV antibiotics changed to PO ciprofloxacin at discharge CT scan of the head showed nothing acute. Back to baseline with her advanced dementia  * Elevated troponin likely due to demand ischemia in setting of history of CAD Stable Appreciate  cardiology input. No further testing.  * Hypertension Increased hydralazine and added metoprolol and Norvasc. Improved  * Diabetes mellitus Sliding scale insulin.  * DVT prophylaxis with Lovenox in hospital  Stable for discharge back to SNF  CONSULTS OBTAINED:  Treatment Team:  Marcina Millard, MD  DRUG ALLERGIES:   Allergies  Allergen Reactions  . Penicillins     Other reaction(s): Unknown    DISCHARGE MEDICATIONS:   Current Discharge Medication List    START taking these medications   Details  amLODipine (NORVASC) 10 MG tablet Take 1 tablet (10 mg total) by mouth daily.    ciprofloxacin (CIPRO) 250 MG tablet Take 1 tablet (250 mg total) by mouth 2 (two) times daily. Qty: 6 tablet, Refills: 0    LORazepam (ATIVAN) 0.5 MG tablet Take 1 tablet (0.5 mg total) by mouth 2 (two) times daily as needed for anxiety. Qty: 20 tablet, Refills: 0    metoprolol tartrate (LOPRESSOR) 25 MG tablet Take 0.5 tablets (12.5 mg total) by mouth 2 (two) times daily.      CONTINUE these medications which have CHANGED   Details  hydrALAZINE (APRESOLINE) 100 MG tablet Take 1 tablet (100 mg total) by mouth 3 (three) times daily.      CONTINUE these medications which have NOT CHANGED   Details  atorvastatin (LIPITOR) 10 MG tablet Take 10 mg by mouth daily.    docusate sodium (COLACE) 100 MG capsule Take 100 mg by mouth  daily as needed for mild constipation or moderate constipation.    donepezil (ARICEPT) 5 MG tablet Take 5 mg by mouth at bedtime.    ferrous sulfate 325 (65 FE) MG tablet Take 325 mg by mouth 3 (three) times daily with meals.    glucagon (GLUCAGEN) 1 MG SOLR injection Inject 1 mg into the muscle once as needed for low blood sugar.    insulin aspart (NOVOLOG) 100 UNIT/ML FlexPen Inject into the skin 4 (four) times daily -  with meals and at bedtime. Inject per sliding scale: If 201-250= 2 units, 251-300= 4 units, 301-350= 6 units. 351-400= 8 units >401 give 8 units  and Call MD.    Lactobacillus (LACTINEX PO) Take 1 g by mouth 2 (two) times daily with a meal.    magnesium oxide (MAG-OX) 400 (241.3 Mg) MG tablet Take 400 mg by mouth daily.    mirtazapine (REMERON) 7.5 MG tablet Take 7.5 mg by mouth at bedtime.    pantoprazole (PROTONIX) 40 MG tablet Take 40 mg by mouth daily.    simethicone (MYLICON) 80 MG chewable tablet Chew 160 mg by mouth 3 (three) times daily.    tamsulosin (FLOMAX) 0.4 MG CAPS capsule Take 0.4 mg by mouth at bedtime.    traMADol (ULTRAM) 50 MG tablet Take 50 mg by mouth every 6 (six) hours as needed for moderate pain or severe pain.        Today   VITAL SIGNS:  Blood pressure 159/82, pulse 56, temperature 98.1 F (36.7 C), temperature source Oral, resp. rate 18, height  (1.575 m), weight 50.939 kg (112 lb 4.8 oz), SpO2 99 %.  I/O:   Intake/Output Summary (Last 24 hours) at 08/07/15 1256 Last data filed at 08/07/15 0830  Gross per 24 hour  Intake      0 ml  Output   1550 ml  Net  -1550 ml    PHYSICAL EXAMINATION:  Physical Exam  GENERAL:  82 y.o.-year-old patient lying in the bed with no acute distress.  LUNGS: Normal breath sounds bilaterally, no wheezing, rales,rhonchi or crepitation. No use of accessory muscles of respiration.  CARDIOVASCULAR: S1, S2 normal. No murmurs, rubs, or gallops.  ABDOMEN: Soft, non-tender, non-distended. Bowel sounds present. No organomegaly or mass.  NEUROLOGIC: Moves all 4 extremities. PSYCHIATRIC: The patient is confused  DATA REVIEW:   CBC  Recent Labs Lab 08/07/15 0457  WBC 3.7  HGB 9.8*  HCT 29.5*  PLT 148*    Chemistries   Recent Labs Lab 08/04/15 0251 08/05/15 0751 08/07/15 0457  NA 142 144 142  K 4.7 3.5 4.1  CL 116* 122* 120*  CO2 16* 18* 16*  GLUCOSE 183* 117* 130*  BUN 45* 41* 32*  CREATININE 1.58* 1.38* 0.89  CALCIUM 8.3* 8.1* 8.0*  MG  --  2.1  --   AST 19  --   --   ALT 22  --   --   ALKPHOS 74  --   --   BILITOT 0.8  --   --      Cardiac Enzymes  Recent Labs Lab 08/04/15 0251  TROPONINI 0.30*    Microbiology Results  Results for orders placed or performed during the hospital encounter of 08/03/15  Blood Culture (routine x 2)     Status: None (Preliminary result)   Collection Time: 08/03/15  8:17 AM  Result Value Ref Range Status   Specimen Description BLOOD LEFT WRIST  Final   Special Requests BOTTLES DRAWN AEROBIC  AND ANAEROBIC  1CC  Final   Culture NO GROWTH 4 DAYS  Final   Report Status PENDING  Incomplete  Blood Culture (routine x 2)     Status: None (Preliminary result)   Collection Time: 08/03/15  8:24 AM  Result Value Ref Range Status   Specimen Description BLOOD LEFT ANTECUBITAL  Final   Special Requests BOTTLES DRAWN AEROBIC AND ANAEROBIC  1CC  Final   Culture NO GROWTH 4 DAYS  Final   Report Status PENDING  Incomplete  Urine culture     Status: Abnormal (Preliminary result)   Collection Time: 08/03/15  9:17 AM  Result Value Ref Range Status   Specimen Description URINE, RANDOM  Final   Special Requests NONE  Final   Culture (A)  Final    >=100,000 COLONIES/mL ESCHERICHIA COLI >=100,000 COLONIES/mL GRAM NEGATIVE RODS REPEATING TO CONFIRM ID AND SENSITIVITIES    Report Status PENDING  Incomplete  Rapid Influenza A&B Antigens ([facility] only)     Status: None   Collection Time: 08/03/15  9:17 AM  Result Value Ref Range Status   Influenza A ([facility]) NEGATIVE NEGATIVE Final   Influenza B ([facility]) NEGATIVE NEGATIVE Final  MRSA PCR Screening     Status: None   Collection Time: 08/03/15  3:19 PM  Result Value Ref Range Status   MRSA by PCR NEGATIVE NEGATIVE Final    Comment:        The GeneXpert MRSA Assay (FDA approved for NASAL specimens only), is one component of a comprehensive MRSA colonization surveillance program. It is not intended to diagnose MRSA infection nor to guide or monitor treatment for MRSA infections.     RADIOLOGY:  No results found.  Follow up with PCP in 1  week.  Management plans discussed with the patient, family and they are in agreement.  CODE STATUS:     Code Status Orders        Start     Ordered   08/03/15 1509  Full code   Continuous     08/03/15 1508    Code Status History    Date Active Date Inactive Code Status Order ID Comments User Context   This patient has a current code status but no historical code status.      TOTAL TIME TAKING CARE OF THIS PATIENT ON DAY OF DISCHARGE: more than 30 minutes.   Milagros LollSudini, Jeilani Grupe R M.D on 08/07/2015 at 12:56 PM  Between 7am to 6pm - Pager - [phone]  After 6pm go to [email or web address] - password [email or web address] [facility]  [facility]  Office  [phone]  CC: Primary care physician; No primary care provider on file.  Note: This dictation was prepared with Dragon dictation along with smaller phrase technology. Any transcriptional errors that result from this process are unintentional.

## 2015-08-07 NOTE — Progress Notes (Signed)
Report called to [facility]lma [facility] at [facility]. Will call EMS for transport.

## 2015-08-08 LAB — URINE CULTURE: Culture: >=100000 — AB

## 2015-08-08 LAB — CULTURE, BLOOD (ROUTINE X 2)
Culture: NO GROWTH
Culture: NO GROWTH

## 2015-09-27 ENCOUNTER — Emergency Department: Payer: Medicare Other

## 2015-09-27 ENCOUNTER — Encounter: Payer: Self-pay | Admitting: Emergency Medicine

## 2015-09-27 ENCOUNTER — Emergency Department
Admission: EM | Admit: 2015-09-27 | Discharge: 2015-09-27 | Disposition: A | Discharge status: Home / Self Care | Payer: Medicare Other | Attending: Emergency Medicine | Admitting: Emergency Medicine

## 2015-09-27 DIAGNOSIS — E119 Type 2 diabetes mellitus without complications: Secondary | ICD-10-CM | POA: Insufficient documentation

## 2015-09-27 DIAGNOSIS — W19XXXA Unspecified fall, initial encounter: Secondary | ICD-10-CM

## 2015-09-27 DIAGNOSIS — S0083XA Contusion of other part of head, initial encounter: Secondary | ICD-10-CM | POA: Diagnosis not present

## 2015-09-27 DIAGNOSIS — N189 Chronic kidney disease, unspecified: Secondary | ICD-10-CM | POA: Diagnosis not present

## 2015-09-27 DIAGNOSIS — I252 Old myocardial infarction: Secondary | ICD-10-CM | POA: Diagnosis not present

## 2015-09-27 DIAGNOSIS — Z79899 Other long term (current) drug therapy: Secondary | ICD-10-CM | POA: Insufficient documentation

## 2015-09-27 DIAGNOSIS — Y939 Activity, unspecified: Secondary | ICD-10-CM | POA: Diagnosis not present

## 2015-09-27 DIAGNOSIS — Z8673 Personal history of transient ischemic attack (TIA), and cerebral infarction without residual deficits: Secondary | ICD-10-CM | POA: Insufficient documentation

## 2015-09-27 DIAGNOSIS — F329 Major depressive disorder, single episode, unspecified: Secondary | ICD-10-CM | POA: Insufficient documentation

## 2015-09-27 DIAGNOSIS — W06XXXA Fall from bed, initial encounter: Secondary | ICD-10-CM | POA: Diagnosis not present

## 2015-09-27 DIAGNOSIS — S0990XA Unspecified injury of head, initial encounter: Secondary | ICD-10-CM | POA: Diagnosis present

## 2015-09-27 DIAGNOSIS — Y999 Unspecified external cause status: Secondary | ICD-10-CM | POA: Insufficient documentation

## 2015-09-27 DIAGNOSIS — I129 Hypertensive chronic kidney disease with stage 1 through stage 4 chronic kidney disease, or unspecified chronic kidney disease: Secondary | ICD-10-CM | POA: Insufficient documentation

## 2015-09-27 DIAGNOSIS — Z794 Long term (current) use of insulin: Secondary | ICD-10-CM | POA: Insufficient documentation

## 2015-09-27 DIAGNOSIS — Y929 Unspecified place or not applicable: Secondary | ICD-10-CM | POA: Insufficient documentation

## 2015-09-27 LAB — CBC WITH DIFFERENTIAL/PLATELET
BASOS ABS: 0 10*3/uL (ref 0–0.1)
Basophils Relative: 1 %
EOS PCT: 1 %
Eosinophils Absolute: 0 10*3/uL (ref 0–0.7)
HEMATOCRIT: 27 % — AB (ref 35.0–47.0)
Hemoglobin: 8.8 g/dL — ABNORMAL LOW (ref 12.0–16.0)
LYMPHS ABS: 0.5 10*3/uL — AB (ref 1.0–3.6)
LYMPHS PCT: 12 %
MCH: 27 pg (ref 26.0–34.0)
MCHC: 32.7 g/dL (ref 32.0–36.0)
MCV: 82.5 fL (ref 80.0–100.0)
MONO ABS: 0.2 10*3/uL (ref 0.2–0.9)
MONOS PCT: 6 %
NEUTROS ABS: 3.5 10*3/uL (ref 1.4–6.5)
Neutrophils Relative %: 82 %
PLATELETS: 223 10*3/uL (ref 150–440)
RBC: 3.27 MIL/uL — ABNORMAL LOW (ref 3.80–5.20)
RDW: 16 % — AB (ref 11.5–14.5)
WBC: 4.3 10*3/uL (ref 3.6–11.0)

## 2015-09-27 LAB — BASIC METABOLIC PANEL
ANION GAP: 8 (ref 5–15)
BUN: 33 mg/dL — AB (ref 6–20)
CO2: 23 mmol/L (ref 22–32)
CREATININE: 1.34 mg/dL — AB (ref 0.44–1.00)
Calcium: 8.6 mg/dL — ABNORMAL LOW (ref 8.9–10.3)
Chloride: 117 mmol/L — ABNORMAL HIGH (ref 101–111)
GFR calc Af Amer: 39 mL/min — ABNORMAL LOW (ref >60)
GFR calc non Af Amer: 34 mL/min — ABNORMAL LOW (ref >60)
GLUCOSE: 160 mg/dL — AB (ref 65–99)
Potassium: 3.9 mmol/L (ref 3.5–5.1)
Sodium: 148 mmol/L — ABNORMAL HIGH (ref 135–145)

## 2015-09-27 LAB — PROTIME-INR
INR: 1.06
Prothrombin Time: 14 seconds (ref 11.4–15.0)

## 2015-09-27 MED ORDER — SODIUM CHLORIDE 0.9 % IV BOLUS (SEPSIS): 1000.0000 mL | Freq: Once | INTRAVENOUS | Status: DC

## 2015-09-27 NOTE — ED Provider Notes (Addendum)
[facility] Emergency Department Provider Note  ____________________________________________   I have reviewed the triage vital signs and the nursing notes.   HISTORY  Chief Complaint Fall    HPI Stephanie Willis is a 82 y.o. female with a history of dementia, history is per nursing home death as well as EMS. Patient has a history of falling out of bed and fell out of bed this morning. Bumped her head. No other injury noted. He is at her baseline dementia according to EMS and nursing home staff.Level 5 chart caveat; no further history available due to patient status.   Past Medical History  Diagnosis Date  . Stroke (HCC)   . Myocardial infarction (HCC)   . Anemia   . Depression   . Dementia   . Diabetes mellitus without complication (HCC)   . Hypertension   . Chronic kidney disease   . GERD (gastroesophageal reflux disease)     Patient Active Problem List   Diagnosis Date Noted  . Protein-calorie malnutrition, severe 08/05/2015  . Sepsis (HCC) 08/03/2015  . UTI (lower urinary tract infection) 08/03/2015  . Delirium 08/03/2015  . Elevated troponin 08/03/2015    Past Surgical History  Procedure Laterality Date  . Cholecystectomy      Current Outpatient Rx  Name  Route  Sig  Dispense  Refill  . acetaminophen (TYLENOL) 500 MG tablet   Oral   Take 1,000 mg by mouth every 8 (eight) hours.         Marland Kitchen. amLODipine (NORVASC) 10 MG tablet   Oral   Take 1 tablet (10 mg total) by mouth daily.         . bisacodyl (DULCOLAX) 5 MG EC tablet   Oral   Take 5 mg by mouth daily as needed for moderate constipation.         . docusate sodium (COLACE) 100 MG capsule   Oral   Take 100 mg by mouth daily as needed for mild constipation.          Marland Kitchen. donepezil (ARICEPT) 10 MG tablet   Oral   Take 10 mg by mouth at bedtime.         . ertapenem (INVANZ) 1 g injection   Intramuscular   Inject 1 g into the muscle daily.         . ferrous sulfate 325  (65 FE) MG tablet   Oral   Take 325 mg by mouth 3 (three) times daily with meals.         Marland Kitchen. glucagon (GLUCAGEN) 1 MG SOLR injection   Intramuscular   Inject 1 mg into the muscle once as needed for low blood sugar.         . hydrALAZINE (APRESOLINE) 100 MG tablet   Oral   Take 1 tablet (100 mg total) by mouth 3 (three) times daily.         . insulin aspart (NOVOLOG) 100 UNIT/ML FlexPen   Subcutaneous   Inject 5 Units into the skin 2 (two) times daily.          Marland Kitchen. lactobacillus acidophilus (BACID) TABS tablet   Oral   Take 1 tablet by mouth 2 (two) times daily.          Marland Kitchen. LORazepam (ATIVAN) 0.5 MG tablet   Oral   Take 1 tablet (0.5 mg total) by mouth 2 (two) times daily as needed for anxiety.   20 tablet   0   . magnesium oxide (MAG-OX) 400 (  241.3 Mg) MG tablet   Oral   Take 400 mg by mouth daily.         . metoprolol tartrate (LOPRESSOR) 25 MG tablet   Oral   Take 0.5 tablets (12.5 mg total) by mouth 2 (two) times daily.         . mirtazapine (REMERON) 7.5 MG tablet   Oral   Take 7.5 mg by mouth at bedtime.         Marland Kitchen omeprazole (PRILOSEC) 20 MG capsule   Oral   Take 20 mg by mouth daily.         . simethicone (MYLICON) 80 MG chewable tablet   Oral   Chew 160 mg by mouth 3 (three) times daily.         . traMADol (ULTRAM) 50 MG tablet   Oral   Take 50 mg by mouth 2 (two) times daily. Pt is also able to take every six hours as needed for pain.         . ciprofloxacin (CIPRO) 250 MG tablet   Oral   Take 1 tablet (250 mg total) by mouth 2 (two) times daily. Patient not taking: Reported on 09/27/2015   6 tablet   0     Allergies Penicillins  Family History  Problem Relation Age of Onset  . Family history unknown: Yes    Social History Social History  Substance Use Topics  . Smoking status: Never Smoker   . Smokeless tobacco: None  . Alcohol Use: None    Review of Systems Cannot further obtain second patient baseline mental  status cLevel 5 chart caveat; no further history available due to patient status.  ____________________________________________   PHYSICAL EXAM:  VITAL SIGNS: ED Triage Vitals  Enc Vitals Group     BP 09/27/15 1205 162/75 mmHg     Pulse Rate 09/27/15 1205 58     Resp 09/27/15 1205 18     Temp 09/27/15 1205 97.5 F (36.4 C)     Temp Source 09/27/15 1205 Axillary     SpO2 09/27/15 1205 100 %     Weight 09/27/15 1205 103 lb 8 oz (46.947 kg)     Height 09/27/15 1205 5\' 2"  (1.575 m)     Head Cir --      Peak Flow --      Pain Score --      Pain Loc --      Pain Edu? --      Excl. in GC? --     Constitutional: Alert and oriented To name and "hospital". Unsure of date..  in no acute distress. Eyes: Conjunctivae are normal. PERRL. EOMI. Head: Large hematoma to right forehead with no skull fracture palpated. Nose: No congestion/rhinnorhea. Mouth/Throat: Mucous membranes are moist.  Oropharynx non-erythematous. Neck: No stridor.   Nontender with no meningismus Cardiovascular: Normal rate, regular rhythm. Grossly normal heart sounds.  Good peripheral circulation. Respiratory: Normal respiratory effort.  No retractions. Lungs CTAB. Abdominal: Soft and nontender. No distention. No guarding no rebound Back:  There is no focal tenderness or step off there is no midline tenderness there are no lesions noted. there is no CVA tenderness Musculoskeletal: No lower extremity tenderness. No joint effusions, no DVT signs strong distal pulses no edema, can range both hips, minimal tenderness palpation of right hip Neurologic: Limited by patient dementia no obvious focal deficits Skin:  Skin is warm, dry and intact. No rash noted. Psychiatric: Mood and affect are normal. Speech and behavior  are normal.  ____________________________________________   LABS (all labs ordered are listed, but only abnormal results are displayed)  Labs Reviewed  CBC WITH DIFFERENTIAL/PLATELET - Abnormal; Notable for  the following:    RBC 3.27 (*)    Hemoglobin 8.8 (*)    HCT 27.0 (*)    RDW 16.0 (*)    Lymphs Abs 0.5 (*)    All other components within normal limits  BASIC METABOLIC PANEL - Abnormal; Notable for the following:    Sodium 148 (*)    Chloride 117 (*)    Glucose, Bld 160 (*)    BUN 33 (*)    Creatinine, Ser 1.34 (*)    Calcium 8.6 (*)    GFR calc non Af Amer 34 (*)    GFR calc Af Amer 39 (*)    All other components within normal limits  PROTIME-INR   ____________________________________________  EKG  I personally interpreted any EKGs ordered by me or triage  ____________________________________________  RADIOLOGY  I reviewed any imaging ordered by me or triage that were performed during my shift and, if possible, patient and/or family made aware of any abnormal findings. ____________________________________________   PROCEDURES  Procedure(s) performed: None  Critical Care performed: None  ____________________________________________   INITIAL IMPRESSION / ASSESSMENT AND PLAN / ED COURSE  Pertinent labs & imaging results that were available during my care of the patient were reviewed by me and considered in my medical decision making (see chart for details).  Non-syncopal fall, patient with a history of frequent falls rolled out of bed. CT and blood work are reassuring, patient does appear to be dehydrated on labs and we will give her IV hydration prior to discharge. At her baseline according to EMS and nursing home staff. Patient has no complaints at this time. Due to this and her hemoglobin is slightly low, she is guaiac-negative no evidence of ongoing GI bleed. Chemotherapy nurse present for rectal exam.  ----------------------------------------- 4:43 PM on 09/27/2015 -----------------------------------------  Patient is no complaints, she is a very difficult IV stick multiple nurses tried and she is refusing further attempts. She tried a container so we tried  again. She is however taking copious by mouth fluids we will encourage nursing him to continue oral hydration. ____________________________________________   FINAL CLINICAL IMPRESSION(S) / ED DIAGNOSES  Final diagnoses:  Fall      This chart was dictated using voice recognition software.  Despite best efforts to proofread,  errors can occur which can change meaning.     Jeanmarie Plant, MD 09/27/15 1559  Jeanmarie Plant, MD 09/27/15 [phone]

## 2015-09-27 NOTE — ED Notes (Signed)
Called and gave verbal to KerrickJenny at [facility].

## 2015-09-27 NOTE — ED Notes (Signed)
Nursing staff attempted IV access total of 6 times, unable to obtain IV access

## 2015-09-27 NOTE — Discharge Instructions (Signed)
Stephanie Willis is somewhat dehydrated please have her drink plenty of fluids this weekend and follow-up with her doctor first thing on Monday.   Fall Prevention in Hospitals, Adult As a hospital patient, your condition and the treatments you receive can increase your risk for falls. Some additional risk factors for falls in a hospital include:  Being in an unfamiliar environment.  Being on bed rest.  Your surgery.  Taking certain medicines.  Your tubing requirements, such as intravenous (IV) therapy or catheters. It is important that you learn how to decrease fall risks while at the hospital. Below are important tips that can help prevent falls. SAFETY TIPS FOR PREVENTING FALLS Talk about your risk of falling.  Ask your health care provider why you are at risk for falling. Is it your medicine, illness, tubing placement, or something else?  Make a plan with your health care provider to keep you safe from falls.  Ask your health care provider or pharmacist about side effects of your medicines. Some medicines can make you dizzy or affect your coordination. Ask for help.  Ask for help before getting out of bed. You may need to press your call button.  Ask for assistance in getting safely to the toilet.  Ask for a walker or cane to be put at your bedside. Ask that most of the side rails on your bed be placed up before your health care provider leaves the room.  Ask family or friends to sit with you.  Ask for things that are out of your reach, such as your glasses, hearing aids, telephone, bedside table, or call button. Follow these tips to avoid falling:  Stay lying or seated, rather than standing, while waiting for help.  Wear rubber-soled slippers or shoes whenever you walk in the hospital.  Avoid quick, sudden movements.  Change positions slowly.  Sit on the side of your bed before standing.  Stand up slowly and wait before you start to walk.  Let your health care provider  know if there is a spill on the floor.  Pay careful attention to the medical equipment, electrical cords, and tubes around you.  When you need help, use your call button by your bed or in the bathroom. Wait for one of your health care providers to help you.  If you feel dizzy or unsure of your footing, return to bed and wait for assistance.  Avoid being distracted by the TV, telephone, or another person in your room.  Do not lean or support yourself on rolling objects, such as IV poles or bedside tables.   This information is not intended to replace advice given to you by your health care provider. Make sure you discuss any questions you have with your health care provider.   Document Released: 04/17/2000 Document Revised: 05/11/2014 Document Reviewed: 12/27/2011 Elsevier Interactive Patient Education [employer]2016 Elsevier Inc.

## 2015-09-27 NOTE — ED Notes (Signed)
[facility] for a fall today around 10. Patient has large hematoma to the right side of the forehead. Patient is alert and oriented to her baseline. No other obvious signs of injury noted.

## 2017-01-26 ENCOUNTER — Other Ambulatory Visit
Admission: RE | Admit: 2017-01-26 | Discharge: 2017-01-26 | Disposition: A | Discharge status: Home / Self Care | Payer: Medicare Other | Source: Ambulatory Visit | Attending: Family Medicine | Admitting: Family Medicine

## 2017-01-26 DIAGNOSIS — R300 Dysuria: Secondary | ICD-10-CM | POA: Insufficient documentation

## 2017-01-27 ENCOUNTER — Other Ambulatory Visit
Admission: RE | Admit: 2017-01-27 | Discharge: 2017-01-27 | Disposition: A | Discharge status: Home / Self Care | Payer: Medicare Other | Source: Ambulatory Visit | Attending: Specialist | Admitting: Specialist

## 2017-01-27 DIAGNOSIS — R300 Dysuria: Secondary | ICD-10-CM | POA: Diagnosis present

## 2017-01-27 LAB — URINALYSIS, COMPLETE (UACMP) WITH MICROSCOPIC
Bacteria, UA: NONE SEEN
Bilirubin Urine: NEGATIVE
GLUCOSE, UA: NEGATIVE mg/dL
Hgb urine dipstick: NEGATIVE
Ketones, ur: NEGATIVE mg/dL
NITRITE: NEGATIVE
PH: 5 (ref 5.0–8.0)
Protein, ur: 100 mg/dL — AB
SPECIFIC GRAVITY, URINE: 1.015 (ref 1.005–1.030)

## 2017-01-28 LAB — URINE CULTURE: SPECIAL REQUESTS: NORMAL

## 2017-04-05 IMAGING — CT CT HEAD W/O CM
3 of 5 series · 16 of 30 positions shown, 17 images · non-contrast
Comparison: 10/08/2013

CLINICAL DATA: Altered mental status, unresponsive, possible
unwitnessed trauma

EXAM:
CT HEAD WITHOUT CONTRAST
CT CERVICAL SPINE WITHOUT CONTRAST
TECHNIQUE: Multidetector CT imaging of the head and cervical spine was
performed following the standard protocol without intravenous
contrast. Multiplanar CT image reconstructions of the cervical spine
were also generated.

[Series 2: soft tissue · axial · 0.43mm/px · z∈[+206,+351]mm · 3 of 30 slices shown, 4 images]
[im 1/30  brain]
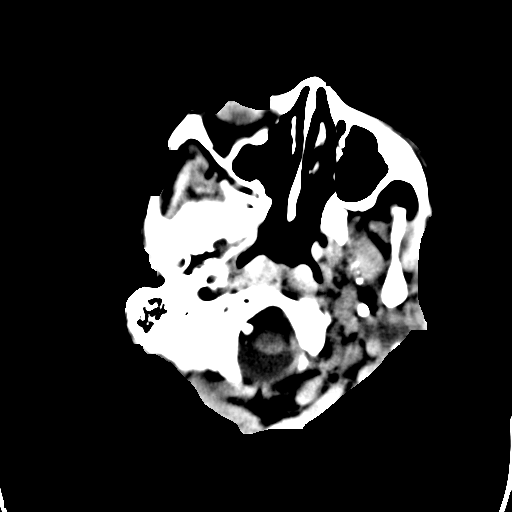
[im 1/30  bone]
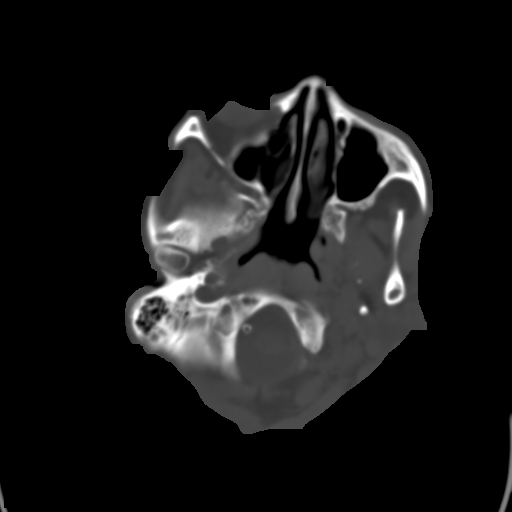
[im 15/30  brain]
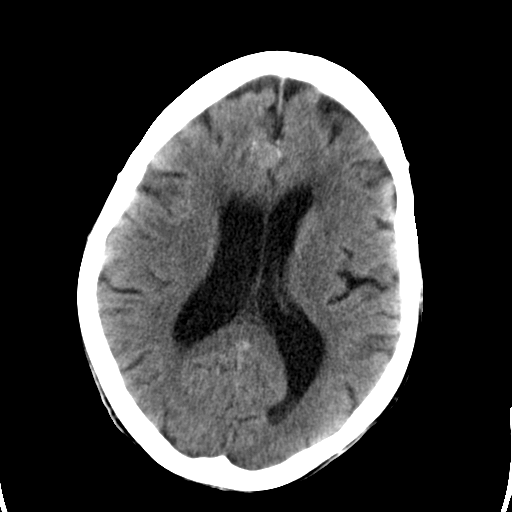
[im 30/30  brain]
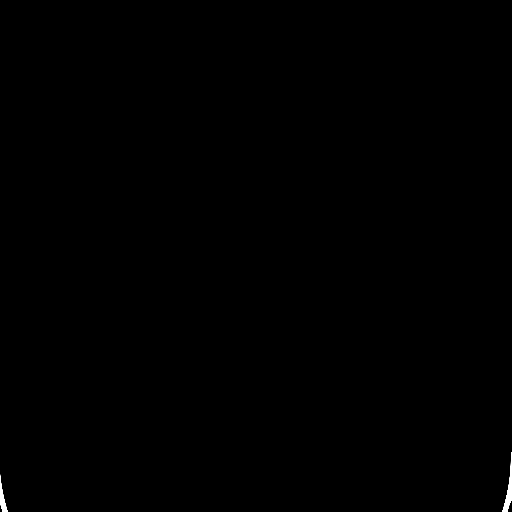

[Series 3: bone · axial · 0.43mm/px · z∈[+216,+340]mm · 7 of 84 slices shown]
[im 11/84  bone]
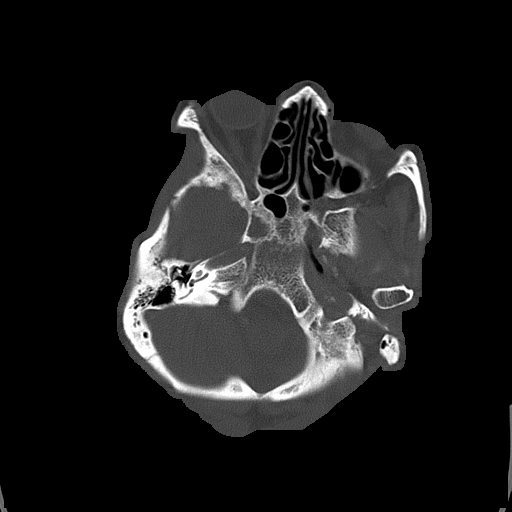
[im 21/84  bone]
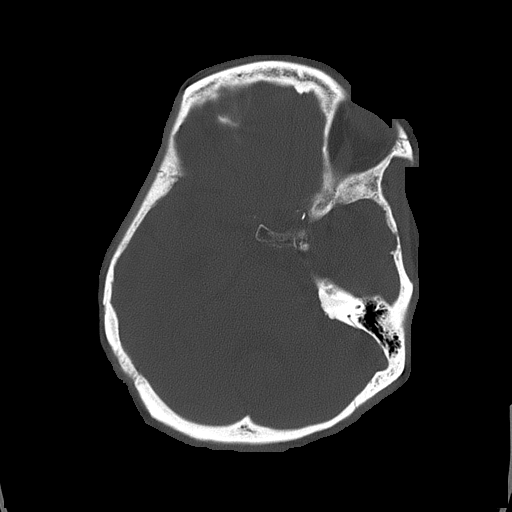
[im 32/84  bone]
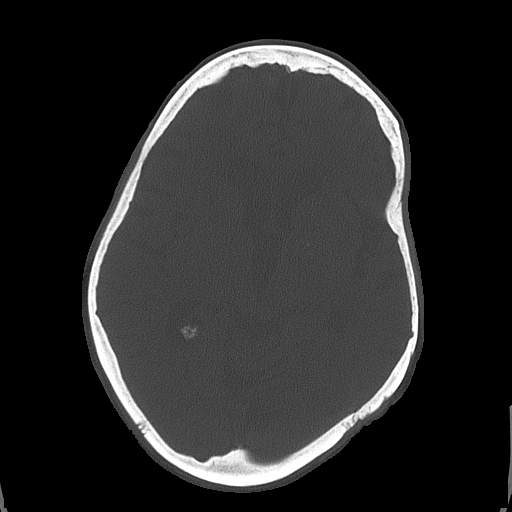
[im 42/84  bone]
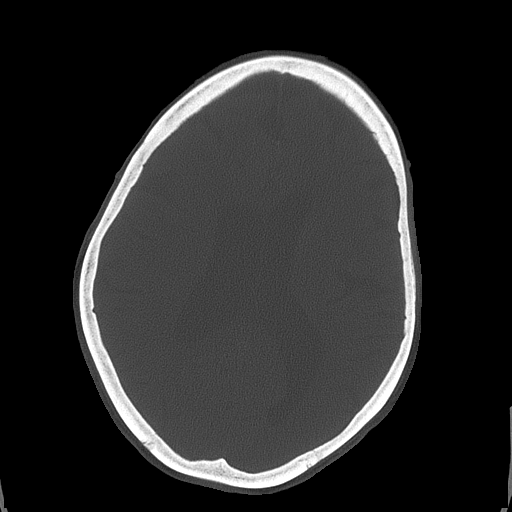
[im 52/84  bone]
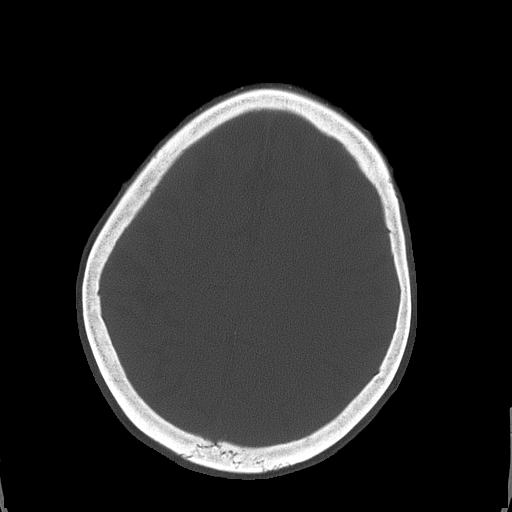
[im 63/84  bone]
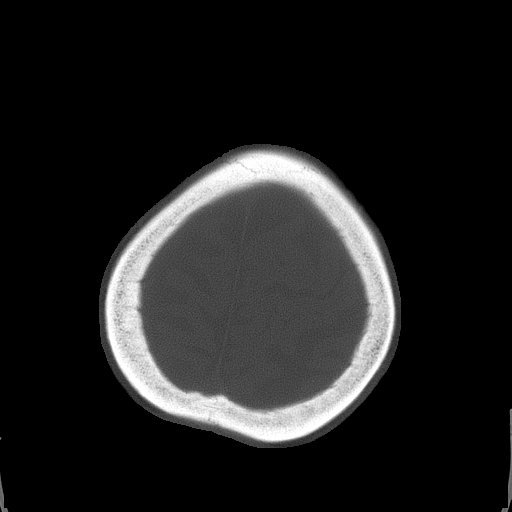
[im 73/84  bone]
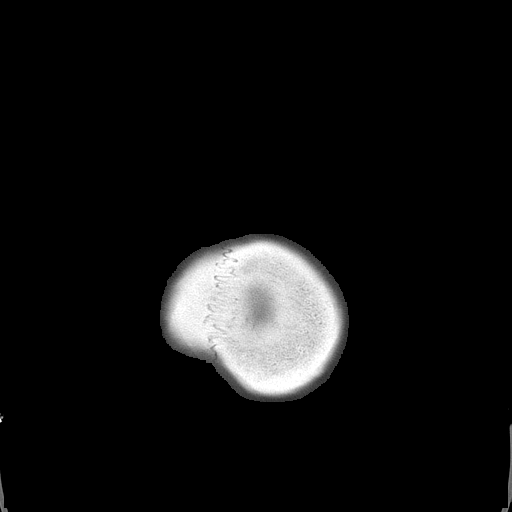

[Series 9: bone recon · axial · 0.42mm/px · z∈[+214,+317]mm · 6 of 74 slices shown]
[im 11/74  bone]
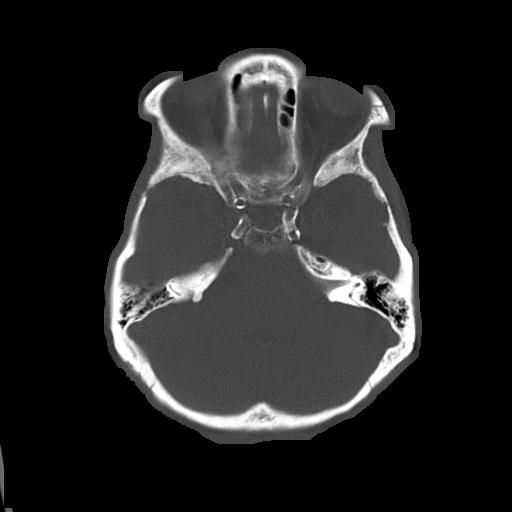
[im 21/74  bone]
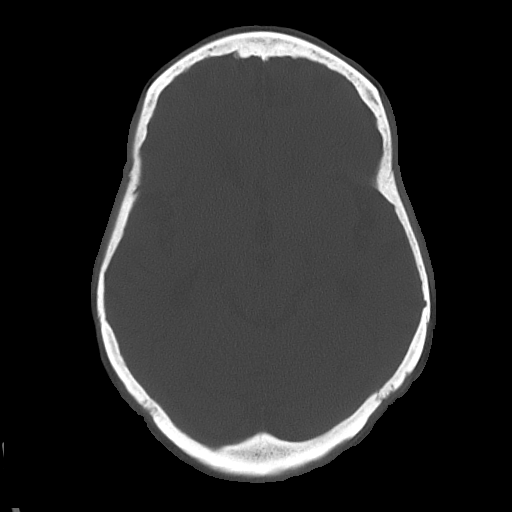
[im 32/74  bone]
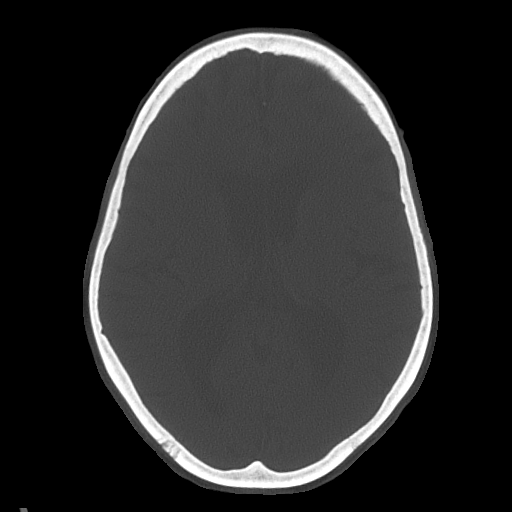
[im 42/74  bone]
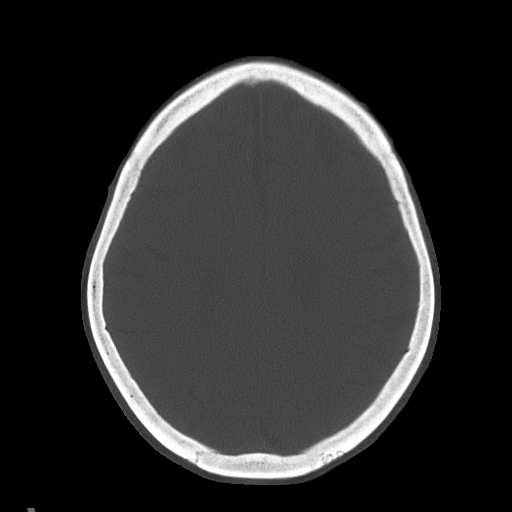
[im 53/74  bone]
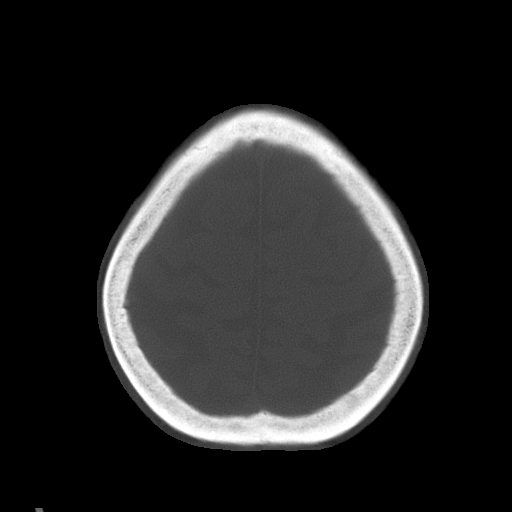
[im 63/74  bone]
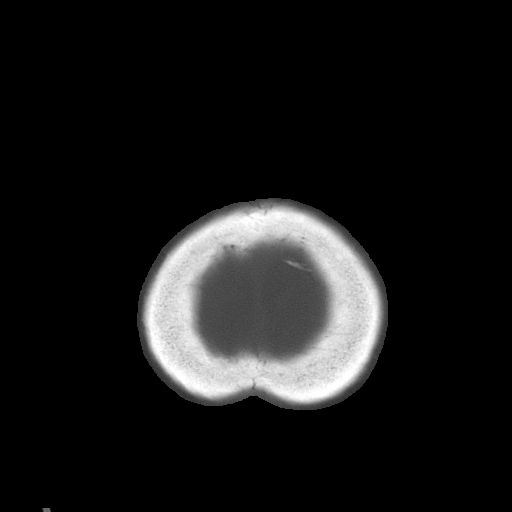

[16 of 30 positions shown; findings below may reference images not displayed]

FINDINGS: CT HEAD FINDINGS

Stable brain atrophy pattern and chronic white matter microvascular
ischemic changes throughout the cerebral hemispheres. Stable
partially calcified hyperdense extra-axial mass along the right
frontal parafalcine region measure approximately 19 x 14 mm
compatible with a meningioma. No acute intracranial hemorrhage,
acute infarction, focal mass effect, edema, midline shift,
herniation, hydrocephalus, or extra-axial fluid collection. Cisterns
are patent. Cerebellar atrophy as well. Orbits are symmetric.
Atherosclerosis of the intracranial vessels at the skullbase. Skull
appears intact. No depressed fracture. Mastoids and sinuses remain
clear.

CT CERVICAL SPINE FINDINGS

Normal cervical spine alignment without subluxation or dislocation.
Slight progression of degenerative disc disease and spondylosis at
C2-3 with increased endplate sclerosis and irregularity and
osteophyte formation. Similar moderate degenerative changes at C5-6
with anterior overhanging osteophytes. Diffuse multilevel facet
arthropathy noted. Odontoid appears intact. Rotation at the C1-2
articulation appears to be positional. No acute displaced fracture
evident.

Carotid atherosclerosis noted. No soft tissue asymmetry in the neck.
Clear lung apices.
IMPRESSION: No acute intracranial abnormality or interval change. Stable small
right frontal parafalcine Extra-axial mass compatible with a
meningioma.

Chronic microvascular ischemic changes

Diffuse cervical spondylosis and degenerative changes, slight
progression since prior study at C2-3.

No acute cervical osseous finding or malalignment

## 2017-08-29 ENCOUNTER — Inpatient Hospital Stay
Admission: EM | Admit: 2017-08-29 | Discharge: 2017-09-02 | DRG: 533 | Disposition: A | Discharge status: Skilled Nursing Facility | Payer: Medicare Other | Attending: Family Medicine | Admitting: Family Medicine

## 2017-08-29 ENCOUNTER — Emergency Department: Payer: Medicare Other

## 2017-08-29 ENCOUNTER — Other Ambulatory Visit: Payer: Self-pay

## 2017-08-29 DIAGNOSIS — Z66 Do not resuscitate: Secondary | ICD-10-CM | POA: Diagnosis present

## 2017-08-29 DIAGNOSIS — S72452A Displaced supracondylar fracture without intracondylar extension of lower end of left femur, initial encounter for closed fracture: Secondary | ICD-10-CM | POA: Diagnosis not present

## 2017-08-29 DIAGNOSIS — M810 Age-related osteoporosis without current pathological fracture: Secondary | ICD-10-CM | POA: Diagnosis present

## 2017-08-29 DIAGNOSIS — N179 Acute kidney failure, unspecified: Secondary | ICD-10-CM

## 2017-08-29 DIAGNOSIS — Z794 Long term (current) use of insulin: Secondary | ICD-10-CM

## 2017-08-29 DIAGNOSIS — D509 Iron deficiency anemia, unspecified: Secondary | ICD-10-CM | POA: Diagnosis present

## 2017-08-29 DIAGNOSIS — D638 Anemia in other chronic diseases classified elsewhere: Secondary | ICD-10-CM | POA: Diagnosis present

## 2017-08-29 DIAGNOSIS — E875 Hyperkalemia: Secondary | ICD-10-CM | POA: Diagnosis not present

## 2017-08-29 DIAGNOSIS — F329 Major depressive disorder, single episode, unspecified: Secondary | ICD-10-CM | POA: Diagnosis present

## 2017-08-29 DIAGNOSIS — Z681 Body mass index (BMI) 19 or less, adult: Secondary | ICD-10-CM

## 2017-08-29 DIAGNOSIS — S72011A Unspecified intracapsular fracture of right femur, initial encounter for closed fracture: Secondary | ICD-10-CM | POA: Diagnosis present

## 2017-08-29 DIAGNOSIS — S72001A Fracture of unspecified part of neck of right femur, initial encounter for closed fracture: Secondary | ICD-10-CM

## 2017-08-29 DIAGNOSIS — M170 Bilateral primary osteoarthritis of knee: Secondary | ICD-10-CM | POA: Diagnosis present

## 2017-08-29 DIAGNOSIS — Y92129 Unspecified place in nursing home as the place of occurrence of the external cause: Secondary | ICD-10-CM

## 2017-08-29 DIAGNOSIS — W19XXXA Unspecified fall, initial encounter: Secondary | ICD-10-CM | POA: Diagnosis present

## 2017-08-29 DIAGNOSIS — E43 Unspecified severe protein-calorie malnutrition: Secondary | ICD-10-CM | POA: Diagnosis present

## 2017-08-29 DIAGNOSIS — Z9049 Acquired absence of other specified parts of digestive tract: Secondary | ICD-10-CM

## 2017-08-29 DIAGNOSIS — K219 Gastro-esophageal reflux disease without esophagitis: Secondary | ICD-10-CM | POA: Diagnosis present

## 2017-08-29 DIAGNOSIS — I129 Hypertensive chronic kidney disease with stage 1 through stage 4 chronic kidney disease, or unspecified chronic kidney disease: Secondary | ICD-10-CM | POA: Diagnosis present

## 2017-08-29 DIAGNOSIS — Z79891 Long term (current) use of opiate analgesic: Secondary | ICD-10-CM

## 2017-08-29 DIAGNOSIS — Z79899 Other long term (current) drug therapy: Secondary | ICD-10-CM

## 2017-08-29 DIAGNOSIS — I252 Old myocardial infarction: Secondary | ICD-10-CM

## 2017-08-29 DIAGNOSIS — S72492A Other fracture of lower end of left femur, initial encounter for closed fracture: Secondary | ICD-10-CM

## 2017-08-29 DIAGNOSIS — E1122 Type 2 diabetes mellitus with diabetic chronic kidney disease: Secondary | ICD-10-CM | POA: Diagnosis present

## 2017-08-29 DIAGNOSIS — E41 Nutritional marasmus: Secondary | ICD-10-CM | POA: Diagnosis present

## 2017-08-29 DIAGNOSIS — F0390 Unspecified dementia without behavioral disturbance: Secondary | ICD-10-CM | POA: Diagnosis present

## 2017-08-29 DIAGNOSIS — E785 Hyperlipidemia, unspecified: Secondary | ICD-10-CM | POA: Diagnosis present

## 2017-08-29 DIAGNOSIS — Z8673 Personal history of transient ischemic attack (TIA), and cerebral infarction without residual deficits: Secondary | ICD-10-CM

## 2017-08-29 DIAGNOSIS — E1165 Type 2 diabetes mellitus with hyperglycemia: Secondary | ICD-10-CM | POA: Diagnosis present

## 2017-08-29 DIAGNOSIS — N183 Chronic kidney disease, stage 3 (moderate): Secondary | ICD-10-CM | POA: Diagnosis present

## 2017-08-29 DIAGNOSIS — Z7401 Bed confinement status: Secondary | ICD-10-CM

## 2017-08-29 DIAGNOSIS — Z880 Allergy status to penicillin: Secondary | ICD-10-CM

## 2017-08-29 DIAGNOSIS — S7290XA Unspecified fracture of unspecified femur, initial encounter for closed fracture: Secondary | ICD-10-CM | POA: Diagnosis present

## 2017-08-29 MED ORDER — MORPHINE SULFATE (PF) 2 MG/ML IV SOLN
INTRAVENOUS | Status: AC
  Filled 2017-08-29: qty 1

## 2017-08-29 MED ORDER — ONDANSETRON HCL 4 MG/2ML IJ SOLN
4.0000 mg | Freq: Once | INTRAMUSCULAR | Status: AC
  Administered 2017-08-29: 4 mg via INTRAVENOUS

## 2017-08-29 MED ORDER — ONDANSETRON HCL 4 MG/2ML IJ SOLN
INTRAMUSCULAR | Status: AC
  Filled 2017-08-29: qty 2

## 2017-08-29 MED ORDER — MORPHINE SULFATE (PF) 2 MG/ML IV SOLN
2.0000 mg | Freq: Once | INTRAVENOUS | Status: AC
  Administered 2017-08-29: 2 mg via INTRAVENOUS

## 2017-08-29 NOTE — ED Notes (Signed)
Pt is too combative from pain at this time to obtain blood work and ekg. md at bedside.

## 2017-08-29 NOTE — ED Triage Notes (Signed)
Pt from [facility] with right leg injury that occurred 5 days ago per ems. Facility secured an xray today with femur fracture results. Pt screaming in pain on arrival. History of dementia.

## 2017-08-30 ENCOUNTER — Encounter
Admission: EM | Disposition: A | Discharge status: Home / Self Care | Payer: Self-pay | Source: Home / Self Care | Attending: Internal Medicine

## 2017-08-30 ENCOUNTER — Other Ambulatory Visit: Payer: Self-pay

## 2017-08-30 ENCOUNTER — Encounter: Payer: Self-pay | Admitting: Emergency Medicine

## 2017-08-30 ENCOUNTER — Emergency Department: Payer: Medicare Other

## 2017-08-30 DIAGNOSIS — S72452A Displaced supracondylar fracture without intracondylar extension of lower end of left femur, initial encounter for closed fracture: Secondary | ICD-10-CM | POA: Diagnosis present

## 2017-08-30 DIAGNOSIS — I129 Hypertensive chronic kidney disease with stage 1 through stage 4 chronic kidney disease, or unspecified chronic kidney disease: Secondary | ICD-10-CM | POA: Diagnosis present

## 2017-08-30 DIAGNOSIS — K219 Gastro-esophageal reflux disease without esophagitis: Secondary | ICD-10-CM | POA: Diagnosis present

## 2017-08-30 DIAGNOSIS — S7290XA Unspecified fracture of unspecified femur, initial encounter for closed fracture: Secondary | ICD-10-CM | POA: Diagnosis present

## 2017-08-30 DIAGNOSIS — M170 Bilateral primary osteoarthritis of knee: Secondary | ICD-10-CM | POA: Diagnosis present

## 2017-08-30 DIAGNOSIS — W19XXXA Unspecified fall, initial encounter: Secondary | ICD-10-CM | POA: Diagnosis present

## 2017-08-30 DIAGNOSIS — N183 Chronic kidney disease, stage 3 (moderate): Secondary | ICD-10-CM | POA: Diagnosis present

## 2017-08-30 DIAGNOSIS — S72001A Fracture of unspecified part of neck of right femur, initial encounter for closed fracture: Secondary | ICD-10-CM | POA: Diagnosis not present

## 2017-08-30 DIAGNOSIS — S72492A Other fracture of lower end of left femur, initial encounter for closed fracture: Secondary | ICD-10-CM | POA: Diagnosis not present

## 2017-08-30 DIAGNOSIS — F0390 Unspecified dementia without behavioral disturbance: Secondary | ICD-10-CM | POA: Diagnosis present

## 2017-08-30 DIAGNOSIS — E41 Nutritional marasmus: Secondary | ICD-10-CM | POA: Diagnosis present

## 2017-08-30 DIAGNOSIS — E875 Hyperkalemia: Secondary | ICD-10-CM | POA: Diagnosis not present

## 2017-08-30 DIAGNOSIS — F329 Major depressive disorder, single episode, unspecified: Secondary | ICD-10-CM | POA: Diagnosis present

## 2017-08-30 DIAGNOSIS — N179 Acute kidney failure, unspecified: Secondary | ICD-10-CM | POA: Diagnosis not present

## 2017-08-30 DIAGNOSIS — E43 Unspecified severe protein-calorie malnutrition: Secondary | ICD-10-CM | POA: Diagnosis present

## 2017-08-30 DIAGNOSIS — E1165 Type 2 diabetes mellitus with hyperglycemia: Secondary | ICD-10-CM | POA: Diagnosis present

## 2017-08-30 DIAGNOSIS — Z7189 Other specified counseling: Secondary | ICD-10-CM | POA: Diagnosis not present

## 2017-08-30 DIAGNOSIS — Z8673 Personal history of transient ischemic attack (TIA), and cerebral infarction without residual deficits: Secondary | ICD-10-CM | POA: Diagnosis not present

## 2017-08-30 DIAGNOSIS — Z515 Encounter for palliative care: Secondary | ICD-10-CM | POA: Diagnosis not present

## 2017-08-30 DIAGNOSIS — E785 Hyperlipidemia, unspecified: Secondary | ICD-10-CM | POA: Diagnosis present

## 2017-08-30 DIAGNOSIS — S72011A Unspecified intracapsular fracture of right femur, initial encounter for closed fracture: Secondary | ICD-10-CM | POA: Diagnosis present

## 2017-08-30 DIAGNOSIS — D509 Iron deficiency anemia, unspecified: Secondary | ICD-10-CM | POA: Diagnosis present

## 2017-08-30 DIAGNOSIS — E1122 Type 2 diabetes mellitus with diabetic chronic kidney disease: Secondary | ICD-10-CM | POA: Diagnosis present

## 2017-08-30 DIAGNOSIS — Z681 Body mass index (BMI) 19 or less, adult: Secondary | ICD-10-CM | POA: Diagnosis not present

## 2017-08-30 DIAGNOSIS — Y92129 Unspecified place in nursing home as the place of occurrence of the external cause: Secondary | ICD-10-CM | POA: Diagnosis not present

## 2017-08-30 LAB — TROPONIN I: TROPONIN I: 0.03 ng/mL — AB (ref <0.03)

## 2017-08-30 LAB — COMPREHENSIVE METABOLIC PANEL
ALK PHOS: 67 U/L (ref 38–126)
ALT: 11 U/L — AB (ref 14–54)
AST: 10 U/L — ABNORMAL LOW (ref 15–41)
Albumin: 3.2 g/dL — ABNORMAL LOW (ref 3.5–5.0)
Anion gap: 3 — ABNORMAL LOW (ref 5–15)
BILIRUBIN TOTAL: 0.2 mg/dL — AB (ref 0.3–1.2)
BUN: 52 mg/dL — ABNORMAL HIGH (ref 6–20)
CALCIUM: 8.2 mg/dL — AB (ref 8.9–10.3)
CO2: 22 mmol/L (ref 22–32)
CREATININE: 2.14 mg/dL — AB (ref 0.44–1.00)
Chloride: 116 mmol/L — ABNORMAL HIGH (ref 101–111)
GFR calc Af Amer: 22 mL/min — ABNORMAL LOW (ref >60)
GFR, EST NON AFRICAN AMERICAN: 19 mL/min — AB (ref >60)
Glucose, Bld: 208 mg/dL — ABNORMAL HIGH (ref 65–99)
Potassium: 7 mmol/L (ref 3.5–5.1)
Sodium: 141 mmol/L (ref 135–145)
TOTAL PROTEIN: 6.6 g/dL (ref 6.5–8.1)

## 2017-08-30 LAB — GLUCOSE, CAPILLARY
GLUCOSE-CAPILLARY: 300 mg/dL — AB (ref 65–99)
GLUCOSE-CAPILLARY: 204 mg/dL — AB (ref 65–99)
GLUCOSE-CAPILLARY: 78 mg/dL (ref 65–99)
Glucose-Capillary: 130 mg/dL — ABNORMAL HIGH (ref 65–99)
Glucose-Capillary: 78 mg/dL (ref 65–99)

## 2017-08-30 LAB — BASIC METABOLIC PANEL
Anion gap: 7 (ref 5–15)
BUN: 49 mg/dL — ABNORMAL HIGH (ref 6–20)
CHLORIDE: 114 mmol/L — AB (ref 101–111)
CO2: 23 mmol/L (ref 22–32)
Calcium: 8 mg/dL — ABNORMAL LOW (ref 8.9–10.3)
Creatinine, Ser: 1.89 mg/dL — ABNORMAL HIGH (ref 0.44–1.00)
GFR calc non Af Amer: 22 mL/min — ABNORMAL LOW (ref >60)
GFR, EST AFRICAN AMERICAN: 25 mL/min — AB (ref >60)
Glucose, Bld: 96 mg/dL (ref 65–99)
POTASSIUM: 6.4 mmol/L — AB (ref 3.5–5.1)
SODIUM: 144 mmol/L (ref 135–145)

## 2017-08-30 LAB — CBC WITH DIFFERENTIAL/PLATELET
BASOS ABS: 0 10*3/uL (ref 0–0.1)
Basophils Relative: 0 %
Eosinophils Absolute: 0.1 10*3/uL (ref 0–0.7)
Eosinophils Relative: 3 %
HEMATOCRIT: 22.7 % — AB (ref 35.0–47.0)
Hemoglobin: 7.3 g/dL — ABNORMAL LOW (ref 12.0–16.0)
LYMPHS PCT: 21 %
Lymphs Abs: 0.9 10*3/uL — ABNORMAL LOW (ref 1.0–3.6)
MCH: 27.4 pg (ref 26.0–34.0)
MCHC: 32.2 g/dL (ref 32.0–36.0)
MCV: 85.1 fL (ref 80.0–100.0)
MONO ABS: 0.4 10*3/uL (ref 0.2–0.9)
Monocytes Relative: 10 %
NEUTROS ABS: 2.7 10*3/uL (ref 1.4–6.5)
Neutrophils Relative %: 66 %
Platelets: 210 10*3/uL (ref 150–440)
RBC: 2.67 MIL/uL — AB (ref 3.80–5.20)
RDW: 16.3 % — AB (ref 11.5–14.5)
WBC: 4.1 10*3/uL (ref 3.6–11.0)

## 2017-08-30 LAB — TYPE AND SCREEN
ABO/RH(D): B NEG
Antibody Screen: NEGATIVE

## 2017-08-30 LAB — CK: CK TOTAL: 31 U/L — AB (ref 38–234)

## 2017-08-30 LAB — MRSA PCR SCREENING: MRSA BY PCR: NEGATIVE

## 2017-08-30 SURGERY — FIXATION, FEMUR, NECK, PERCUTANEOUS, USING SCREW
Anesthesia: Choice | Laterality: Right

## 2017-08-30 MED ORDER — RISAQUAD PO CAPS
1.0000 | ORAL_CAPSULE | Freq: Two times a day (BID) | ORAL | Status: DC
  Administered 2017-08-31 – 2017-09-01 (×2): 1 via ORAL
  Filled 2017-08-30 (×7): qty 1

## 2017-08-30 MED ORDER — DEXTROSE 50 % IV SOLN
1.0000 | Freq: Once | INTRAVENOUS | Status: AC
  Administered 2017-08-30: 50 mL via INTRAVENOUS
  Filled 2017-08-30: qty 50

## 2017-08-30 MED ORDER — DEXTROSE 50 % IV SOLN
50.0000 mL | Freq: Once | INTRAVENOUS | Status: AC
  Administered 2017-08-30: 50 mL via INTRAVENOUS
  Filled 2017-08-30: qty 50

## 2017-08-30 MED ORDER — AMLODIPINE BESYLATE 10 MG PO TABS
10.0000 mg | ORAL_TABLET | Freq: Every day | ORAL | Status: DC
  Administered 2017-08-31 – 2017-09-01 (×2): 10 mg via ORAL
  Filled 2017-08-30 (×3): qty 1

## 2017-08-30 MED ORDER — INSULIN ASPART 100 UNIT/ML IV SOLN
10.0000 [IU] | Freq: Once | INTRAVENOUS | Status: AC
  Administered 2017-08-30: 10 [IU] via INTRAVENOUS
  Filled 2017-08-30: qty 0.1

## 2017-08-30 MED ORDER — INSULIN GLARGINE 100 UNIT/ML ~~LOC~~ SOLN
6.0000 [IU] | Freq: Every day | SUBCUTANEOUS | Status: DC
  Administered 2017-08-31 – 2017-09-01 (×2): 6 [IU] via SUBCUTANEOUS
  Filled 2017-08-30 (×4): qty 0.06

## 2017-08-30 MED ORDER — ALBUTEROL SULFATE (2.5 MG/3ML) 0.083% IN NEBU
2.5000 mg | INHALATION_SOLUTION | Freq: Once | RESPIRATORY_TRACT | Status: AC
  Administered 2017-08-30: 2.5 mg via RESPIRATORY_TRACT
  Filled 2017-08-30: qty 3

## 2017-08-30 MED ORDER — SODIUM CHLORIDE 0.9 % IV BOLUS
500.0000 mL | Freq: Once | INTRAVENOUS | Status: AC
  Administered 2017-08-30: 500 mL via INTRAVENOUS

## 2017-08-30 MED ORDER — MIRTAZAPINE 15 MG PO TABS
15.0000 mg | ORAL_TABLET | Freq: Every day | ORAL | Status: DC
  Filled 2017-08-30 (×3): qty 1

## 2017-08-30 MED ORDER — SENNOSIDES-DOCUSATE SODIUM 8.6-50 MG PO TABS: 1.0000 | ORAL_TABLET | Freq: Every evening | ORAL | Status: DC | PRN

## 2017-08-30 MED ORDER — METOPROLOL TARTRATE 25 MG PO TABS
12.5000 mg | ORAL_TABLET | Freq: Two times a day (BID) | ORAL | Status: DC
  Administered 2017-09-01: 12.5 mg via ORAL
  Filled 2017-08-30 (×6): qty 1

## 2017-08-30 MED ORDER — INSULIN ASPART 100 UNIT/ML ~~LOC~~ SOLN
0.0000 [IU] | Freq: Three times a day (TID) | SUBCUTANEOUS | Status: DC
  Administered 2017-08-30: 5 [IU] via SUBCUTANEOUS
  Administered 2017-08-30: 2 [IU] via SUBCUTANEOUS
  Filled 2017-08-30 (×2): qty 1

## 2017-08-30 MED ORDER — SODIUM BICARBONATE 8.4 % IV SOLN
50.0000 meq | Freq: Once | INTRAVENOUS | Status: AC
  Administered 2017-08-30: 50 meq via INTRAVENOUS
  Filled 2017-08-30: qty 50

## 2017-08-30 MED ORDER — SODIUM POLYSTYRENE SULFONATE 15 GM/60ML PO SUSP
30.0000 g | Freq: Once | ORAL | Status: AC
  Administered 2017-08-30: 30 g via ORAL
  Filled 2017-08-30: qty 120

## 2017-08-30 MED ORDER — FUROSEMIDE 10 MG/ML IJ SOLN
40.0000 mg | Freq: Once | INTRAMUSCULAR | Status: AC
  Administered 2017-08-30: 40 mg via INTRAVENOUS
  Filled 2017-08-30: qty 4

## 2017-08-30 MED ORDER — ENSURE ENLIVE PO LIQD
237.0000 mL | Freq: Two times a day (BID) | ORAL | Status: DC
  Administered 2017-08-31 – 2017-09-02 (×4): 237 mL via ORAL

## 2017-08-30 MED ORDER — ONDANSETRON HCL 4 MG/2ML IJ SOLN: 4.0000 mg | Freq: Four times a day (QID) | INTRAMUSCULAR | Status: DC | PRN

## 2017-08-30 MED ORDER — DONEPEZIL HCL 5 MG PO TABS
10.0000 mg | ORAL_TABLET | Freq: Every day | ORAL | Status: DC
  Filled 2017-08-30 (×4): qty 2

## 2017-08-30 MED ORDER — SODIUM CHLORIDE 0.45 % IV SOLN
INTRAVENOUS | Status: DC
  Administered 2017-08-30 – 2017-09-02 (×4): via INTRAVENOUS

## 2017-08-30 MED ORDER — HYDRALAZINE HCL 50 MG PO TABS
100.0000 mg | ORAL_TABLET | Freq: Three times a day (TID) | ORAL | Status: DC
  Administered 2017-08-30 – 2017-09-01 (×4): 100 mg via ORAL
  Filled 2017-08-30 (×8): qty 2

## 2017-08-30 MED ORDER — FERROUS SULFATE 325 (65 FE) MG PO TABS
325.0000 mg | ORAL_TABLET | Freq: Three times a day (TID) | ORAL | Status: DC
  Administered 2017-08-30 – 2017-09-01 (×4): 325 mg via ORAL
  Filled 2017-08-30 (×6): qty 1

## 2017-08-30 MED ORDER — SIMETHICONE 80 MG PO CHEW
160.0000 mg | CHEWABLE_TABLET | Freq: Three times a day (TID) | ORAL | Status: DC
  Administered 2017-08-30 – 2017-09-01 (×3): 160 mg via ORAL
  Filled 2017-08-30 (×12): qty 2

## 2017-08-30 MED ORDER — ACETAMINOPHEN 325 MG PO TABS
650.0000 mg | ORAL_TABLET | Freq: Four times a day (QID) | ORAL | Status: DC | PRN
  Administered 2017-09-01: 650 mg via ORAL
  Filled 2017-08-30 (×2): qty 2

## 2017-08-30 MED ORDER — PANTOPRAZOLE SODIUM 40 MG PO TBEC
40.0000 mg | DELAYED_RELEASE_TABLET | Freq: Every day | ORAL | Status: DC
  Administered 2017-08-31 – 2017-09-01 (×2): 40 mg via ORAL
  Filled 2017-08-30 (×3): qty 1

## 2017-08-30 MED ORDER — ADULT MULTIVITAMIN LIQUID CH
15.0000 mL | Freq: Every day | ORAL | Status: DC
  Administered 2017-08-31 – 2017-09-01 (×2): 15 mL via ORAL
  Filled 2017-08-30 (×4): qty 15

## 2017-08-30 MED ORDER — BISACODYL 5 MG PO TBEC: 5.0000 mg | DELAYED_RELEASE_TABLET | Freq: Every day | ORAL | Status: DC | PRN

## 2017-08-30 MED ORDER — MORPHINE SULFATE (PF) 2 MG/ML IV SOLN
2.0000 mg | INTRAVENOUS | Status: DC | PRN
  Administered 2017-08-30 – 2017-08-31 (×4): 2 mg via INTRAVENOUS
  Filled 2017-08-30 (×4): qty 1

## 2017-08-30 MED ORDER — IPRATROPIUM-ALBUTEROL 0.5-2.5 (3) MG/3ML IN SOLN: 3.0000 mL | RESPIRATORY_TRACT | Status: DC | PRN

## 2017-08-30 MED ORDER — PATIROMER SORBITEX CALCIUM 8.4 G PO PACK
8.4000 g | PACK | Freq: Once | ORAL | Status: AC
  Administered 2017-08-30: 8.4 g via ORAL
  Filled 2017-08-30: qty 4

## 2017-08-30 MED ORDER — LAMOTRIGINE 25 MG PO TABS
37.5000 mg | ORAL_TABLET | Freq: Every day | ORAL | Status: DC
  Filled 2017-08-30 (×2): qty 1.5
  Filled 2017-08-30 (×2): qty 2

## 2017-08-30 MED ORDER — HEPARIN SODIUM (PORCINE) 5000 UNIT/ML IJ SOLN: 5000.0000 [IU] | Freq: Two times a day (BID) | INTRAMUSCULAR | Status: DC

## 2017-08-30 MED ORDER — INSULIN ASPART 100 UNIT/ML ~~LOC~~ SOLN
10.0000 [IU] | Freq: Once | SUBCUTANEOUS | Status: AC
  Administered 2017-08-30: 10 [IU] via INTRAVENOUS
  Filled 2017-08-30: qty 1

## 2017-08-30 MED ORDER — LORAZEPAM 0.5 MG PO TABS
0.5000 mg | ORAL_TABLET | Freq: Two times a day (BID) | ORAL | Status: DC | PRN
  Administered 2017-09-01: 0.5 mg via ORAL
  Filled 2017-08-30: qty 1

## 2017-08-30 NOTE — H&P (Signed)
[facility] [place] at [facility]   PATIENT NAME: Stephanie Willis    MR#:  [id number]  DATE OF BIRTH:  [birth year]  DATE OF ADMISSION:  08/29/2017  PRIMARY CARE PHYSICIAN: Dimple Casey, MD   REQUESTING/REFERRING PHYSICIAN: Rebecka Apley, MD  CHIEF COMPLAINT:   Chief Complaint  Patient presents with  . Leg Pain    HISTORY OF PRESENT ILLNESS:  Stephanie Willis  is a 84 y.o. female with a known history of dementia (AAOx1), T2IDDM, HTN, HLD, CAD/MI, CVA, CKD, OAB, GERD, anemia, depression who p/w 1d Hx hip/leg pain. Pt w/ severe dementia, AAOx1 (to self only). She does not cooperate, answer questions or follow commands appropriately. She swats at me and pushes me away when I try to perform a physical examination. All she can tell me is her name, that she is cold, and that her hips hurt. She can provide no further Hx. ROS is also unobtainable, apart from the aforementioned.  Based on what I am able to glean from records and discussion w/ staff, pt is a resident of an SNF, and has reportedly been lying in bed screaming in pain for five days. It is unclear if there was any fall and/or injury. The pt cannot recall. Pt endorses pain in both hips/legs. Imaging performed in the ED demonstrates the following: -CT R hip: Minimally impacted appearing superolateral subcapital impaction fracture of the right femoral neck on femoral head. Findings suspicious for subtle impaction fracture of the superolateral femoral head-neck junction. -CT L knee: Subtle transverse fracture of the distal femoral metaphysis is identified resulting in slight dorsal angulation of the femoral condyles. This fracture exits the medial and lateral supracondylar cortex, laterally and also medially for example. Supracondylar fracture of the distal femur with slight dorsal angulation of the femoral condyles.  Pt's K+ found to be 7.0 on admission. EKG (-) peaked TW. Pt rcd IVF (NS 500cc), Lasix, Bicarbonate,  Albuterol, Insulin and Dextrose in ED, and is also ordered for Kayexalate.  As noted above, ROS unobtainable (2/2 dementia). Based on what I am told, pt's baseline functional status is poor, and it is reported that pt is largely bedbound at the SNF, though I cannot corroborate this. Pt also reportedly DNR/DNI. Pt's family is not reachable.  PAST MEDICAL HISTORY:   Past Medical History:  Diagnosis Date  . Anemia   . Chronic kidney disease   . Dementia   . Depression   . Diabetes mellitus without complication (HCC)   . GERD (gastroesophageal reflux disease)   . Hypertension   . Myocardial infarction (HCC)   . Stroke [facility])     PAST SURGICAL HISTORY:   Past Surgical History:  Procedure Laterality Date  . CHOLECYSTECTOMY      SOCIAL HISTORY:   Social History   Tobacco Use  . Smoking status: Never Smoker  Substance Use Topics  . Alcohol use: Not Currently    FAMILY HISTORY:   Family History  Family history unknown: Yes    DRUG ALLERGIES:   Allergies  Allergen Reactions  . Penicillins Other (See Comments)    Reaction:  Unknown     REVIEW OF SYSTEMS:   Review of Systems  Unable to perform ROS: Dementia  Musculoskeletal: Positive for joint pain.   Pt endorses feeling cold, (+) pain in B/L legs/hips. ROS otherwise unobtainable (2/2 dementia).  MEDICATIONS AT HOME:   Prior to Admission medications   Medication Sig Start Date End Date Taking? Authorizing Provider  acetaminophen (  TYLENOL) 500 MG tablet Take 1,000 mg by mouth every 6 (six) hours as needed for mild pain.    Yes [provider]  amLODipine (NORVASC) 10 MG tablet Take 1 tablet (10 mg total) by mouth daily. 08/07/15  Yes Sudini, Wardell Heath, MD  docusate sodium (COLACE) 100 MG capsule Take 100 mg by mouth daily as needed for mild constipation.    Yes [provider]  donepezil (ARICEPT) 10 MG tablet Take 10 mg by mouth at bedtime.   Yes [provider]  ferrous sulfate 325 (65 FE)  MG tablet Take 325 mg by mouth 3 (three) times daily with meals.   Yes [provider]  glucagon (GLUCAGEN) 1 MG SOLR injection Inject 1 mg into the muscle once as needed for low blood sugar.   Yes [provider]  guaiFENesin (ROBITUSSIN) 100 MG/5ML liquid Take 200 mg by mouth 4 (four) times daily as needed for cough.   Yes [provider]  hydrALAZINE (APRESOLINE) 100 MG tablet Take 1 tablet (100 mg total) by mouth 3 (three) times daily. 08/07/15  Yes Sudini, Wardell Heath, MD  insulin aspart (NOVOLOG) 100 UNIT/ML FlexPen Inject 2-8 Units into the skin 4 (four) times daily -  before meals and at bedtime. SSI   Yes [provider]  insulin glargine (LANTUS) 100 UNIT/ML injection Inject 6 Units into the skin at bedtime.   Yes [provider]  ipratropium-albuterol (DUONEB) 0.5-2.5 (3) MG/3ML SOLN Take 3 mLs by nebulization every 4 (four) hours as needed (shortness of breath).   Yes [provider]  lactobacillus acidophilus (BACID) TABS tablet Take 1 tablet by mouth 2 (two) times daily.    Yes [provider]  lamoTRIgine (LAMICTAL) 25 MG tablet Take 37.5 mg by mouth at bedtime.   Yes [provider]  LORazepam (ATIVAN) 0.5 MG tablet Take 1 tablet (0.5 mg total) by mouth 2 (two) times daily as needed for anxiety. 08/07/15  Yes Sudini, Wardell Heath, MD  magnesium oxide (MAG-OX) 400 (241.3 Mg) MG tablet Take 400 mg by mouth daily.   Yes [provider]  metoprolol tartrate (LOPRESSOR) 25 MG tablet Take 0.5 tablets (12.5 mg total) by mouth 2 (two) times daily. 08/07/15  Yes Sudini, Wardell Heath, MD  mirtazapine (REMERON) 15 MG tablet Take 15 mg by mouth at bedtime.   Yes [provider]  Multiple Vitamin (MULTIVITAMIN) LIQD Take 15 mLs by mouth daily.   Yes [provider]  omeprazole (PRILOSEC) 20 MG capsule Take 20 mg by mouth daily.   Yes [provider]  oxyCODONE (OXY IR/ROXICODONE) 5 MG immediate release tablet Take  2.5 mg by mouth every 12 (twelve) hours as needed for moderate pain or severe pain.   Yes [provider]  senna-docusate (SENOKOT-S) 8.6-50 MG tablet Take 1 tablet by mouth daily.   Yes [provider]  simethicone (MYLICON) 80 MG chewable tablet Chew 160 mg by mouth 3 (three) times daily.   Yes [provider]  ciprofloxacin (CIPRO) 250 MG tablet Take 1 tablet (250 mg total) by mouth 2 (two) times daily. Patient not taking: Reported on 09/27/2015 08/07/15   Milagros Loll, MD      VITAL SIGNS:  Blood pressure 132/63, pulse (!) 59, resp. rate 12, height 5' (1.524 m), weight 45.4 kg (100 lb), SpO2 98 %.  PHYSICAL EXAMINATION:  Physical Exam  Constitutional: She appears well-developed. She appears cachectic. She is active and uncooperative. She is easily aroused.  Non-toxic appearance. She does not  have a sickly appearance. She does not appear ill. She appears distressed (+) mild distress 2/2 pain.  HENT:  Head: Normocephalic and atraumatic.  Mouth/Throat: No oropharyngeal exudate.  Eyes: Conjunctivae, EOM and lids are normal. No scleral icterus.  Neck: Normal range of motion. Neck supple. No JVD present. No thyromegaly present.  Cardiovascular: Normal rate, regular rhythm, S1 normal and S2 normal.  No extrasystoles are present. Exam reveals no gallop, no S3, no S4, no distant heart sounds and no friction rub.  Murmur heard.  Systolic murmur is present with a grade of 3/6. Pulmonary/Chest: Effort normal and breath sounds normal. No accessory muscle usage or stridor. No respiratory distress. She has no decreased breath sounds. She has no wheezes. She has no rhonchi. She has no rales.  Abdominal: Soft. Bowel sounds are normal. She exhibits no distension. There is no tenderness. There is no rebound and no guarding.  Musculoskeletal: She exhibits edema.  Lymphadenopathy:    She has no cervical adenopathy.  Neurological: She is alert and easily aroused. She is  disoriented.  Skin: Skin is warm, dry and intact. No rash noted. She is not diaphoretic. No erythema.  Psychiatric: Her mood appears not anxious. Her affect is not angry, not blunt, not labile and not inappropriate. Her speech is tangential. Her speech is not rapid and/or pressured, not delayed and not slurred. She is combative. She is not agitated, not aggressive, not hyperactive, not slowed, not withdrawn and not actively hallucinating. Cognition and memory are impaired. She does not exhibit a depressed mood. She is communicative. She exhibits abnormal recent memory and abnormal remote memory. She is inattentive.   LABORATORY PANEL:   CBC Recent Labs  Lab 08/30/17 0043  WBC 4.1  HGB 7.3*  HCT 22.7*  PLT 210   ------------------------------------------------------------------------------------------------------------------  Chemistries  Recent Labs  Lab 08/30/17 0043  NA 141  K 7.0*  CL 116*  CO2 22  GLUCOSE 208*  BUN 52*  CREATININE 2.14*  CALCIUM 8.2*  AST 10*  ALT 11*  ALKPHOS 67  BILITOT 0.2*   ------------------------------------------------------------------------------------------------------------------  Cardiac Enzymes Recent Labs  Lab 08/30/17 0043  TROPONINI 0.03*   ------------------------------------------------------------------------------------------------------------------  RADIOLOGY:  Ct Knee Left Wo Contrast  Result Date: 08/30/2017 CLINICAL DATA:  Left knee pain after injury 5 days ago. EXAM: CT OF THE LEFT KNEE WITHOUT CONTRAST TECHNIQUE: Multidetector CT imaging of the LEFT knee was performed according to the standard protocol. Multiplanar CT image reconstructions were also generated. COMPARISON:  Same day radiographs of the knee. FINDINGS: Bones/Joint/Cartilage The bones are markedly osteopenic limiting fracture assessment. Subtle transverse fracture of the distal femoral metaphysis, series 6/29 is identified resulting in slight dorsal angulation  of the femoral condyles, series 10/27. This fracture exits the medial and lateral supracondylar cortex, series 6/38 laterally and also medially for example. No definite intra-articular extension is seen. There is a moderate suprapatellar joint effusion without fat fluid level both small hematocrit level along its caudal aspect, series 10/30. Marked femorotibial and patellofemoral joint space narrowing and spurring is seen. The proximal tibia and fibula are intact. Ligaments Suboptimally assessed by CT. Muscles and Tendons Generalized muscle atrophy in keeping with patient's age. No fatty replacement. Extensor mechanism tendons are intact. Soft tissues No significant soft tissue swelling. IMPRESSION: Supracondylar fracture of the distal femur with slight dorsal angulation of the femoral condyles. Associated moderate suprapatellar joint effusion with trace hematocrit level. Tricompartmental osteoarthritis of the knee. Electronically Signed   By: Tollie Eth M.D.   On:  08/30/2017 02:04   Ct Hip Right Wo Contrast  Result Date: 08/30/2017 CLINICAL DATA:  Right hip pain after remote fall. EXAM: CT OF THE RIGHT HIP WITHOUT CONTRAST TECHNIQUE: Multidetector CT imaging of the right hip was performed according to the standard protocol. Multiplanar CT image reconstructions were also generated. COMPARISON:  Same day radiographs of the right hip. FINDINGS: Images of the left knee were also included as part of this study. Please see the separate left knee report for further details. Bones/Joint/Cartilage Minimally impacted appearing superolateral subcapital impaction fracture of the right femoral neck on femoral head, series 6/41 through 44. No parasymphyseal or pubic rami fracture. Acetabulum appears intact. No joint dislocation. Physiologic fluid in the right hip joint. No intra-articular loose bodies. Ligaments Suboptimally assessed by CT. Muscles and Tendons Global atrophy about the right hip. Soft tissues Mild soft  tissue anasarca. No soft tissue hematoma. Dense atherosclerotic calcifications along the course of the right external iliac, common femoral and femoral arteries. IMPRESSION: Findings suspicious for subtle impaction fracture of the superolateral femoral head-neck junction. No joint dislocation. Intact pubic rami and acetabulum. No parasymphyseal fracture. Electronically Signed   By: Tollie Eth M.D.   On: 08/30/2017 01:50   Dg Hip Unilat W Or Wo Pelvis 2-3 Views Right  Result Date: 08/30/2017 CLINICAL DATA:  Right leg injury 5 days ago. EXAM: DG HIP (WITH OR WITHOUT PELVIS) 2-3V RIGHT COMPARISON:  09/27/2015 FINDINGS: Diffuse bone demineralization. Patient rotation limits evaluation. There is mild sclerosis along the base of the right femoral head with slight cortical angulation laterally. This may indicate an impacted fracture of the subcapital region. Visualized pelvis appears intact. Degenerative changes in the lower lumbar spine and hips. Extensive vascular calcifications. Surgical clips in the right upper quadrant. IMPRESSION: Linear sclerosis and slight cortical irregularity along the subcapital right femoral neck may indicate an impacted fracture. No dislocation. Electronically Signed   By: Burman Nieves M.D.   On: 08/30/2017 00:26   Dg Femur Min 2 Views Left  Result Date: 08/30/2017 CLINICAL DATA:  Pain after fall injury 5 days ago. EXAM: LEFT FEMUR 2 VIEWS COMPARISON:  None. FINDINGS: Comminuted appearing distal femoral fracture with subtle transverse component across the metaphysis and sagittal component along the periphery of the lateral femoral condyle. 2 mm of lateral displacement of the lateral femoral condylar fracture fragment is noted. Moderate suprapatellar joint effusion. No joint dislocation. Left hip is intact. Extensive vascular calcifications are identified along the course of the femoral and popliteal as well as tibial arteries. IMPRESSION: 1. Acute comminuted fracture of the distal  femur with transverse component across the metaphysis and sagittal component involving the lateral femoral condyle. Extension of fracture into the knee joint is not confirmed on images provided. Moderate joint effusion is present. 2. Osteoarthritis of the femorotibial and patellofemoral compartment. Electronically Signed   By: Tollie Eth M.D.   On: 08/30/2017 00:45   Dg Femur Min 2 Views Right  Result Date: 08/30/2017 CLINICAL DATA:  Right leg injury 5 days ago. EXAM: RIGHT FEMUR 2 VIEWS COMPARISON:  Pelvis and right hip radiographs from the same day and pelvic radiographs from 09/27/2015 FINDINGS: Subtle lucency about the right parasymphysis is suspicious for nondisplaced fracture versus skin fold artifact. The study is limited due to overlying skin folds and osteopenic appearance of the bones. Femoroacetabular impingement morphology of the femoral head with broadening of the femoral head-neck junction superolaterally. Probable rim of osteophytes at the femoral head-neck junction. Slight cortical angulation along the superolateral  femoral neck cannot exclude a fracture. No hip joint dislocation. Shallow right acetabulum likely reflecting remote developmental dysplasia of the hip. No suspicious osseous lesions. Marked osteoarthritis with chondrocalcinosis of the femorotibial compartment. Moderate joint space narrowing of the femorotibial compartment. No knee joint effusion. Extensive arterial calcifications are noted of the right thigh and knee. IMPRESSION: 1. Subtle lucency about the right parasymphysis could either be due to skin fold artifacts or nondisplaced fractures. Based on the right hip radiographs, suspect this is artifactual. Similarly, subtle cortical irregularity along the superolateral aspect of the femoral head could potentially represent a nondisplaced fracture with adjacent rim of osteophytes at the femoral head-neck junction. CT may help for further correlation. 2. Femoroacetabular impingement  morphology of the femoral head and neck. No hip joint dislocation. 3. Shallow appearing right acetabulum which may reflect developmental dysplasia of the hip. 4. Osteoarthritis of the right knee. Electronically Signed   By: Tollie Eth M.D.   On: 08/30/2017 00:37   IMPRESSION AND PLAN:   A/P: 46F w/ dementia presents from NH w/ fractures, hyperkalemia.  1.) Fractures: Pt p/w B/L LE pain. Continues to c/o pain at the time of my Hx/examination. (+) TTP B/L hips/legs. Imaging performed in ED demonstrates, "Minimally impacted appearing superolateral subcapital impaction fracture of the right femoral neck on femoral head," and, "Supracondylar fracture of the distal left femur with slight dorsal angulation of the femoral condyles." Based on pt's advanced age, severe dementia and poor functional status, in addition to her medical comorbidities and poor general physical condition, I believe she is an exceedingly poor surgical candidate. Orthopedic Surgery consult, however pt may be a good candidate for Palliative Care and possibly Hospice. Bedrest, urinary catheter, pain ctrl, fall precautions.  2.) Hyperkalemia: K+ 7.0 on admission. CPK WNL. EKG (-) peaked TW. Rcd IVF (NS 500cc), Lasix, Bicarbonate, Albuterol, Insulin and Dextrose in ED. Ordered for Kayexalate. Monitor BMP.  3.) AKI on CKD: Cr 2.14 on present admission. Previous baseline 1.3 (as of 09/2015). Pt likely w/ baseline CKD III (2/2 DM, HTN, aged kidney). Suspect superimposed prerenal AKI, w/ BUN 52, Cr 2.14, ratio = ~24.3, suggestive of intravascular volume depletion. Rcd 500cc NS bolus in ED, c/w 1/2NS x500cc add'l. Monitor BMP, avoid nephrotoxins.  4.) Hyperglycemia/T2IDDM: Glucose 208. MSSI. C/w Lantus.  5.) Normocytic anemia: Hgb 7.3, MCV 85.1. Microcytic anemia, likely anemia of chronic disease. Documented Hx anemia. No evidence of acute/active blood loss. Monitor Hgb. C/w Iron supplementation.  6.) Hypoalbuminemia: Albumin 3.2. Pt  emaciated. Suspect protein calorie malnutrition. Prealbumin pending. Nutrition consult.  7.) HTN: c/w Norvasc, Hydralazine, Lopressor.  8.) GERD: Protonix (FS Prilosec). Simethicone PRN.  9.) Dementia: c/w Aricept, Lamictal.  10.) Depression: c/w Ativan, Remeron, Lamictal.  11.) FEN/GI: Cardiac diabetic diet, IVF 1/2NS, Protonix, Simethicone.  12.) DVT PPx: Heparin 5000u SQ TID.  13.) Code status: DNR/DNI.  14.) Disposition: Admission, pt expected to stay > 2 midnights.   All the records are reviewed and case discussed with ED provider. Management plans discussed with the patient, family and they are in agreement.  CODE STATUS: DNR/DNI.  TOTAL TIME TAKING CARE OF THIS PATIENT: 90 minutes.    Barbaraann Rondo M.D on 08/30/2017 at 4:16 AM  Between 7am to 6pm - Pager - [phone]  After 6pm go to [email or web address] - Social research officer, government  [facility] [place] [facility]  Office  [phone]  CC: Primary care physician; Dimple Casey, MD   Note: This dictation was prepared with Dragon dictation along with smaller phrase  technology. Any transcriptional errors that result from this process are unintentional.

## 2017-08-30 NOTE — Plan of Care (Signed)
  Problem: Clinical Measurements: Goal: Will remain free from infection Outcome: Progressing Note:  Remains afebrile   Problem: Safety: Goal: Ability to remain free from injury will improve Outcome: Progressing Note:  Fall precautions in place   Problem: Clinical Measurements: Goal: Diagnostic test results will improve Outcome: Not Progressing Note:  K 7.0 BUN 52/2.14 Hgb 7.3/22.7   Problem: Pain Managment: Goal: General experience of comfort will improve Outcome: Not Progressing Note:  PRN medications    Problem: Activity: Goal: Ability to ambulate and perform ADLs will improve Outcome: Not Progressing Note:  Bed rest at this time with knee immobilizer   Problem: Pain Management: Goal: Pain level will decrease Outcome: Not Progressing Note:  PRN pain medications

## 2017-08-30 NOTE — ED Notes (Signed)
Lab to come for venipuncture assistance.

## 2017-08-30 NOTE — Progress Notes (Signed)
CRITICAL VALUE ALERT  Critical Value:  K 6.4  Date & Time Notied:  1530  Provider Notified:Dr. Luberta Mutter  Orders Received/Actions taken: yes

## 2017-08-30 NOTE — Progress Notes (Signed)
Pt pulled her IV out, IV consult placed.

## 2017-08-30 NOTE — ED Provider Notes (Signed)
[facility] Emergency Department Provider Note   ____________________________________________   First MD Initiated Contact with Patient 08/29/17 2306     (approximate)  I have reviewed the triage vital signs and the nursing notes.   HISTORY  Chief Complaint Leg Pain  History limited by patient history of dementia  HPI Stephanie Willis is a 84 y.o. female who comes in to the hospital today with a possible distal femur fracture.  The patient was sent from her nursing home with not much information.  It is unknown if the patient has had a fall but for the past 5 days the patient has been laying in bed screaming in pain.  This morning when the staff was examining the patient they touched her leg and the patient screamed in pain.  The paperwork states that the patient was having some left leg pain so they did perform an x-ray and she was found to have a distal femur fracture.  The patient was sent in for evaluation.  The patient also screams when you touch her right leg.  When you asked the patient what is going on she keeps saying she does not understand.  She does not recall falling and does not recall an injury.   Past Medical History:  Diagnosis Date  . Anemia   . Chronic kidney disease   . Dementia   . Depression   . Diabetes mellitus without complication (HCC)   . GERD (gastroesophageal reflux disease)   . Hypertension   . Myocardial infarction (HCC)   . Stroke [facility])     Patient Active Problem List   Diagnosis Date Noted  . Protein-calorie malnutrition, severe 08/05/2015  . Sepsis (HCC) 08/03/2015  . UTI (lower urinary tract infection) 08/03/2015  . Delirium 08/03/2015  . Elevated troponin 08/03/2015    Past Surgical History:  Procedure Laterality Date  . CHOLECYSTECTOMY      Prior to Admission medications   Medication Sig Start Date End Date Taking? Authorizing Provider  acetaminophen (TYLENOL) 500 MG tablet Take 1,000 mg by mouth every 6  (six) hours as needed for mild pain.    Yes [provider]  amLODipine (NORVASC) 10 MG tablet Take 1 tablet (10 mg total) by mouth daily. 08/07/15  Yes Sudini, Wardell Heath, MD  docusate sodium (COLACE) 100 MG capsule Take 100 mg by mouth daily as needed for mild constipation.    Yes [provider]  donepezil (ARICEPT) 10 MG tablet Take 10 mg by mouth at bedtime.   Yes [provider]  ferrous sulfate 325 (65 FE) MG tablet Take 325 mg by mouth 3 (three) times daily with meals.   Yes [provider]  glucagon (GLUCAGEN) 1 MG SOLR injection Inject 1 mg into the muscle once as needed for low blood sugar.   Yes [provider]  guaiFENesin (ROBITUSSIN) 100 MG/5ML liquid Take 200 mg by mouth 4 (four) times daily as needed for cough.   Yes [provider]  hydrALAZINE (APRESOLINE) 100 MG tablet Take 1 tablet (100 mg total) by mouth 3 (three) times daily. 08/07/15  Yes Sudini, Wardell Heath, MD  insulin aspart (NOVOLOG) 100 UNIT/ML FlexPen Inject 2-8 Units into the skin 4 (four) times daily -  before meals and at bedtime. SSI   Yes [provider]  insulin glargine (LANTUS) 100 UNIT/ML injection Inject 6 Units into the skin at bedtime.   Yes [provider]  ipratropium-albuterol (DUONEB) 0.5-2.5 (3) MG/3ML SOLN Take 3 mLs by nebulization  every 4 (four) hours as needed (shortness of breath).   Yes [provider]  lactobacillus acidophilus (BACID) TABS tablet Take 1 tablet by mouth 2 (two) times daily.    Yes [provider]  lamoTRIgine (LAMICTAL) 25 MG tablet Take 37.5 mg by mouth at bedtime.   Yes [provider]  LORazepam (ATIVAN) 0.5 MG tablet Take 1 tablet (0.5 mg total) by mouth 2 (two) times daily as needed for anxiety. 08/07/15  Yes Sudini, Wardell Heath, MD  magnesium oxide (MAG-OX) 400 (241.3 Mg) MG tablet Take 400 mg by mouth daily.   Yes [provider]  metoprolol tartrate (LOPRESSOR) 25 MG tablet Take 0.5  tablets (12.5 mg total) by mouth 2 (two) times daily. 08/07/15  Yes Sudini, Wardell Heath, MD  mirtazapine (REMERON) 15 MG tablet Take 15 mg by mouth at bedtime.   Yes [provider]  Multiple Vitamin (MULTIVITAMIN) LIQD Take 15 mLs by mouth daily.   Yes [provider]  omeprazole (PRILOSEC) 20 MG capsule Take 20 mg by mouth daily.   Yes [provider]  oxyCODONE (OXY IR/ROXICODONE) 5 MG immediate release tablet Take 2.5 mg by mouth every 12 (twelve) hours as needed for moderate pain or severe pain.   Yes [provider]  senna-docusate (SENOKOT-S) 8.6-50 MG tablet Take 1 tablet by mouth daily.   Yes [provider]  simethicone (MYLICON) 80 MG chewable tablet Chew 160 mg by mouth 3 (three) times daily.   Yes [provider]  ciprofloxacin (CIPRO) 250 MG tablet Take 1 tablet (250 mg total) by mouth 2 (two) times daily. Patient not taking: Reported on 09/27/2015 08/07/15   Milagros Loll, MD    Allergies Penicillins  Family History  Family history unknown: Yes    Social History Social History   Tobacco Use  . Smoking status: Never Smoker  Substance Use Topics  . Alcohol use: Not Currently  . Drug use: Not on file    Review of Systems   ____________________________________________   PHYSICAL EXAM:  VITAL SIGNS: ED Triage Vitals  Enc Vitals Group     BP 08/29/17 2256 (!) 183/106     Pulse Rate 08/29/17 2256 90     Resp 08/29/17 2252 (!) 24     Temp --      Temp Source 08/29/17 2256 Axillary     SpO2 08/29/17 2256 100 %     Weight 08/29/17 2255 100 lb (45.4 kg)     Height 08/29/17 2255 5' (1.524 m)     Head Circumference --      Peak Flow --      Pain Score 08/30/17 0036 Asleep     Pain Loc --      Pain Edu? --      Excl. in GC? --     Constitutional: Alert and disoriented. Well appearing and in moderate to severe distress. Eyes: Conjunctivae are normal. PERRL. EOMI. Head: Atraumatic. Nose: No  congestion/rhinnorhea. Mouth/Throat: Mucous membranes are moist.  Oropharynx non-erythematous. Neck: No cervical spine tenderness to palpation. Cardiovascular: Normal rate, regular rhythm. Systolic murmur.  Good peripheral circulation. Respiratory: Normal respiratory effort.  No retractions. Lungs CTAB. Gastrointestinal: Soft and nontender. No distention.  Positive bowel sounds Musculoskeletal: Tenderness to palpation of left lower extremity and right lower extremity near the hip Neurologic:  Normal speech and language.  Patient speech is fluent she does not recall any injury Skin:  Skin is warm, dry and intact.  Psychiatric: Mood and affect are normal.  ____________________________________________   LABS (all labs ordered are listed, but only abnormal results are displayed)  Labs Reviewed  CBC WITH DIFFERENTIAL/PLATELET - Abnormal; Notable for the following components:      Result Value   RBC 2.67 (*)    Hemoglobin 7.3 (*)    HCT 22.7 (*)    RDW 16.3 (*)    Lymphs Abs 0.9 (*)    All other components within normal limits  COMPREHENSIVE METABOLIC PANEL - Abnormal; Notable for the following components:   Potassium 7.0 (*)    Chloride 116 (*)    Glucose, Bld 208 (*)    BUN 52 (*)    Creatinine, Ser 2.14 (*)    Calcium 8.2 (*)    Albumin 3.2 (*)    AST 10 (*)    ALT 11 (*)    Total Bilirubin 0.2 (*)    GFR calc non Af Amer 19 (*)    GFR calc Af Amer 22 (*)    Anion gap 3 (*)    All other components within normal limits  TROPONIN I - Abnormal; Notable for the following components:   Troponin I 0.03 (*)    All other components within normal limits  GLUCOSE, CAPILLARY - Abnormal; Notable for the following components:   Glucose-Capillary 300 (*)    All other components within normal limits  CK - Abnormal; Notable for the following components:   Total CK 31 (*)    All other components within normal limits  URINALYSIS, COMPLETE (UACMP) WITH MICROSCOPIC  TYPE AND SCREEN    ____________________________________________  EKG  ED ECG REPORT I, Rebecka Apley, the attending physician, personally viewed and interpreted this ECG.   Date: 08/30/2017  EKG Time: 0001  Rate: 63  Rhythm: normal sinus rhythm  Axis: left axis deviation  Intervals:none  ST&T Change: none  ____________________________________________  RADIOLOGY  ED MD interpretation:    Right hip x-ray: Linear sclerosis and slight cortical irregularity along the subcapital right femoral neck may indicate an impacted fracture, no dislocation  Right femur x-ray: Subtle lucency about the right parasymphysis, could be either due to skinfold artifacts or nondisplaced fractures.  Femoral acetabular impingement morphology of the femoral head and neck, no hip joint dislocation, shallow appearing right acetabulum which may reflect developmental dysplasia of the hip, osteoarthritis of the right knee.  Left femur x-ray: Acute comminuted fracture of the distal femur with transverse component across the metaphysis and sagittal component involving the lateral femoral condyle, extension of fracture into the knee joint is not confirmed, moderate joint effusion present, osteoarthritis of the femoral-tibial and patellofemoral compartment.  CT left knee: Supracondylar fracture of the distal femur with slight dorsal angulation of the femoral condyles, associated moderate suprapatellar joint effusion with trace hematocrit level, tricompartmental osteoarthritis of the knee  CT right hip: Findings suspicious for subtle impaction fracture of the superior lateral femoral head/neck junction, no joint dislocation, intact pubic rami and acetabulum.  Official radiology report(s): Ct Knee Left Wo Contrast  Result Date: 08/30/2017 CLINICAL DATA:  Left knee pain after injury 5 days ago. EXAM: CT OF THE LEFT KNEE WITHOUT CONTRAST TECHNIQUE: Multidetector CT imaging of the LEFT knee was performed according to the standard  protocol. Multiplanar CT image reconstructions were also generated. COMPARISON:  Same day radiographs of the knee. FINDINGS: Bones/Joint/Cartilage The bones are markedly osteopenic limiting fracture assessment. Subtle transverse fracture of the distal femoral metaphysis, series 6/29 is identified resulting in slight dorsal angulation of the femoral condyles, series  10/27. This fracture exits the medial and lateral supracondylar cortex, series 6/38 laterally and also medially for example. No definite intra-articular extension is seen. There is a moderate suprapatellar joint effusion without fat fluid level both small hematocrit level along its caudal aspect, series 10/30. Marked femorotibial and patellofemoral joint space narrowing and spurring is seen. The proximal tibia and fibula are intact. Ligaments Suboptimally assessed by CT. Muscles and Tendons Generalized muscle atrophy in keeping with patient's age. No fatty replacement. Extensor mechanism tendons are intact. Soft tissues No significant soft tissue swelling. IMPRESSION: Supracondylar fracture of the distal femur with slight dorsal angulation of the femoral condyles. Associated moderate suprapatellar joint effusion with trace hematocrit level. Tricompartmental osteoarthritis of the knee. Electronically Signed   By: Tollie Eth M.D.   On: 08/30/2017 02:04   Ct Hip Right Wo Contrast  Result Date: 08/30/2017 CLINICAL DATA:  Right hip pain after remote fall. EXAM: CT OF THE RIGHT HIP WITHOUT CONTRAST TECHNIQUE: Multidetector CT imaging of the right hip was performed according to the standard protocol. Multiplanar CT image reconstructions were also generated. COMPARISON:  Same day radiographs of the right hip. FINDINGS: Images of the left knee were also included as part of this study. Please see the separate left knee report for further details. Bones/Joint/Cartilage Minimally impacted appearing superolateral subcapital impaction fracture of the right femoral  neck on femoral head, series 6/41 through 44. No parasymphyseal or pubic rami fracture. Acetabulum appears intact. No joint dislocation. Physiologic fluid in the right hip joint. No intra-articular loose bodies. Ligaments Suboptimally assessed by CT. Muscles and Tendons Global atrophy about the right hip. Soft tissues Mild soft tissue anasarca. No soft tissue hematoma. Dense atherosclerotic calcifications along the course of the right external iliac, common femoral and femoral arteries. IMPRESSION: Findings suspicious for subtle impaction fracture of the superolateral femoral head-neck junction. No joint dislocation. Intact pubic rami and acetabulum. No parasymphyseal fracture. Electronically Signed   By: Tollie Eth M.D.   On: 08/30/2017 01:50   Dg Hip Unilat W Or Wo Pelvis 2-3 Views Right  Result Date: 08/30/2017 CLINICAL DATA:  Right leg injury 5 days ago. EXAM: DG HIP (WITH OR WITHOUT PELVIS) 2-3V RIGHT COMPARISON:  09/27/2015 FINDINGS: Diffuse bone demineralization. Patient rotation limits evaluation. There is mild sclerosis along the base of the right femoral head with slight cortical angulation laterally. This may indicate an impacted fracture of the subcapital region. Visualized pelvis appears intact. Degenerative changes in the lower lumbar spine and hips. Extensive vascular calcifications. Surgical clips in the right upper quadrant. IMPRESSION: Linear sclerosis and slight cortical irregularity along the subcapital right femoral neck may indicate an impacted fracture. No dislocation. Electronically Signed   By: Burman Nieves M.D.   On: 08/30/2017 00:26   Dg Femur Min 2 Views Left  Result Date: 08/30/2017 CLINICAL DATA:  Pain after fall injury 5 days ago. EXAM: LEFT FEMUR 2 VIEWS COMPARISON:  None. FINDINGS: Comminuted appearing distal femoral fracture with subtle transverse component across the metaphysis and sagittal component along the periphery of the lateral femoral condyle. 2 mm of lateral  displacement of the lateral femoral condylar fracture fragment is noted. Moderate suprapatellar joint effusion. No joint dislocation. Left hip is intact. Extensive vascular calcifications are identified along the course of the femoral and popliteal as well as tibial arteries. IMPRESSION: 1. Acute comminuted fracture of the distal femur with transverse component across the metaphysis and sagittal component involving the lateral femoral condyle. Extension of fracture into the knee joint is  not confirmed on images provided. Moderate joint effusion is present. 2. Osteoarthritis of the femorotibial and patellofemoral compartment. Electronically Signed   By: Tollie Eth M.D.   On: 08/30/2017 00:45   Dg Femur Min 2 Views Right  Result Date: 08/30/2017 CLINICAL DATA:  Right leg injury 5 days ago. EXAM: RIGHT FEMUR 2 VIEWS COMPARISON:  Pelvis and right hip radiographs from the same day and pelvic radiographs from 09/27/2015 FINDINGS: Subtle lucency about the right parasymphysis is suspicious for nondisplaced fracture versus skin fold artifact. The study is limited due to overlying skin folds and osteopenic appearance of the bones. Femoroacetabular impingement morphology of the femoral head with broadening of the femoral head-neck junction superolaterally. Probable rim of osteophytes at the femoral head-neck junction. Slight cortical angulation along the superolateral femoral neck cannot exclude a fracture. No hip joint dislocation. Shallow right acetabulum likely reflecting remote developmental dysplasia of the hip. No suspicious osseous lesions. Marked osteoarthritis with chondrocalcinosis of the femorotibial compartment. Moderate joint space narrowing of the femorotibial compartment. No knee joint effusion. Extensive arterial calcifications are noted of the right thigh and knee. IMPRESSION: 1. Subtle lucency about the right parasymphysis could either be due to skin fold artifacts or nondisplaced fractures. Based on the  right hip radiographs, suspect this is artifactual. Similarly, subtle cortical irregularity along the superolateral aspect of the femoral head could potentially represent a nondisplaced fracture with adjacent rim of osteophytes at the femoral head-neck junction. CT may help for further correlation. 2. Femoroacetabular impingement morphology of the femoral head and neck. No hip joint dislocation. 3. Shallow appearing right acetabulum which may reflect developmental dysplasia of the hip. 4. Osteoarthritis of the right knee. Electronically Signed   By: Tollie Eth M.D.   On: 08/30/2017 00:37    ____________________________________________   PROCEDURES  Procedure(s) performed: please, see procedure note(s).  .Critical Care Performed by: Rebecka Apley, MD Authorized by: Rebecka Apley, MD   Critical care provider statement:    Critical care time (minutes):  30   Critical care start time:  08/30/2017 1:30 AM   Critical care end time:  08/30/2017 2:00 AM   Critical care time was exclusive of:  Separately billable procedures and treating other patients   Critical care was necessary to treat or prevent imminent or life-threatening deterioration of the following conditions:  Metabolic crisis   Critical care was time spent personally by me on the following activities:  Development of treatment plan with patient or surrogate, discussions with consultants, evaluation of patient's response to treatment, examination of patient, obtaining history from patient or surrogate, ordering and performing treatments and interventions, ordering and review of laboratory studies, ordering and review of radiographic studies, pulse oximetry, re-evaluation of patient's condition and review of old charts   I assumed direction of critical care for this patient from another provider in my specialty: no      Critical Care performed: Yes, see critical care  note(s)  ____________________________________________   INITIAL IMPRESSION / ASSESSMENT AND PLAN / ED COURSE  As part of my medical decision making, I reviewed the following data within the electronic MEDICAL RECORD NUMBER Notes from prior ED visits and [place] Controlled Substance Database   This is a 83 year old female who comes into the hospital today with some leg pain and a concern for some fractures of her distal femur.  The patient was having pain on her right so we also did some imaging studies on the right.  The patient had some x-rays and  there was a concern that the patient may have a right hip fracture as well as a distal femur fracture at the knee.  They are very subtle so I did order some CTs to further evaluate the fractures.  I did check some blood work on the patient as she is likely to be admitted.  It was found that the patient had a potassium of 7.0.  The patient's EKG did not show any peaked T waves.  The patient also has a creatinine of 2.14 which is elevated from 2 years ago.  The patient's hemoglobin is 7.3 and her hematocrit is 22.7.  The patient did receive 2 mg of morphine.  I will give her some sodium bicarbonate, albuterol, Lasix, insulin, dextrose and a 500 mL bolus of normal saline.  The patient will be admitted to the hospitalist service.  I will contact the orthopedic surgeon to consult on the patient.      ____________________________________________   FINAL CLINICAL IMPRESSION(S) / ED DIAGNOSES  Final diagnoses:  Hyperkalemia  Closed fracture of right hip, initial encounter (HCC)  Other closed fracture of distal end of left femur, initial encounter (HCC)  Acute renal failure, unspecified acute renal failure type [facility])     ED Discharge Orders    None       Note:  This document was prepared using Dragon voice recognition software and may include unintentional dictation errors.    Rebecka Apley, MD 08/30/17 [phone]

## 2017-08-30 NOTE — Progress Notes (Signed)
Initial Nutrition Assessment  DOCUMENTATION CODES:   Severe malnutrition in context of chronic illness  INTERVENTION:   Recommend Oscal with vitamin D daily   Ensure Enlive po BID, each supplement provides 350 kcal and 20 grams of protein  MVI daily  Magic cup TID with meals, each supplement provides 290 kcal and 9 grams of protein  Dysphagia 3 diet   NUTRITION DIAGNOSIS:   Severe Malnutrition related to chronic illness(dementia, advanced age ) as evidenced by severe fat depletion, severe muscle depletion.  GOAL:   Patient will meet greater than or equal to 90% of their needs  MONITOR:   PO intake, Supplement acceptance, Labs, Weight trends, Skin, I & O's  REASON FOR ASSESSMENT:   Consult    ASSESSMENT:   84 y.o. female with a known history of dementia (AAOx1), T2IDDM, HTN, HLD, CAD/MI, CVA, CKD, OAB, GERD, anemia, depression who p/w 1d Hx hip/leg pain. Pt w/ severe dementia, AAOx1 (to self only). Pt found to have hyperkalemia and hip fracture   Met with pt in room today. Unable to obtain nutrition related history as pt with dementia. No family at bedside. Pt with severe muscle and fat depletions; suspect poor appetite and oral intake at baseline. RD will change pt to dysphagia 3 diet and order supplements. Pt already ordered for MVI; recommend add Oscal with D daily. Per chart, no recent wt history but pt's wt appears stable from several years ago. RD will monitor for GOC.    Medications reviewed and include: risaquad, ferrous sulfate, insulin, remeron, MVI, protonix, simethicone, morphine  Labs reviewed: K 7.0(H), Cl 116(H), BUN 52(H), creat 2.14(H), Ca 8.2(L) adj. 8.84(L), alb 3.2(L) Hgb 7.3(L), Hct 22.7(L) cbgs- 300, 130, 78 x 24 hrs  Nutrition-Focused physical exam completed. Findings are severe fat and  muscle depletions over entire body, and no edema.   Diet Order:  Fall precautions DIET DYS 3 Room service appropriate? Yes; Fluid consistency: Thin  EDUCATION  NEEDS:   Not appropriate for education at this time  Skin:  Skin Assessment: Reviewed RN Assessment  Last BM:  4/28  Height:   Ht Readings from Last 1 Encounters:  08/29/17 5' (1.524 m)    Weight:   Wt Readings from Last 1 Encounters:  08/30/17 99 lb (44.9 kg)    Ideal Body Weight:  45.4 kg  BMI:  Body mass index is 19.33 kg/m.  Estimated Nutritional Needs:   Kcal:  1200-1400kcal/day   Protein:  68-77g/day   Fluid:  >1.1L/day   Koleen Distance MS, RD, LDN Pager #[phone] After Hours Pager: [phone]

## 2017-08-30 NOTE — ED Notes (Signed)
Critical potassium of 7.0 called from lab and critical troponin of 0.03 called from lab. Dr. Zenda Alpers notified, no new verbal orders received.

## 2017-08-30 NOTE — ED Notes (Signed)
Pt more calm, intermittently sleeping unless staff attempts to complete ekg or draw blood.

## 2017-08-30 NOTE — Progress Notes (Signed)
Inpatient Diabetes Program Recommendations  AACE/ADA: New Consensus Statement on Inpatient Glycemic Control (2015)  Target Ranges:  Prepandial:   less than 140 mg/dL      Peak postprandial:   less than 180 mg/dL (1-2 hours)      Critically ill patients:  140 - 180 mg/dL   Lab Results  Component Value Date   GLUCAP 130 (H) 08/30/2017    Review of Glycemic ControlResults for Stephanie, Willis (MRN [id number]) as of 08/30/2017 10:47  Ref. Range 08/30/2017 01:54 08/30/2017 08:02  Glucose-Capillary Latest Ref Range: 65 - 99 mg/dL 409 (H) 811 (H)   Diabetes history: Type 2 DM  Outpatient Diabetes medications: Lantus 6 units q HS, Novolog 2-8 units 4 times a day Current orders for Inpatient glycemic control:  Novolog moderate tid with meals, Lantus 6 units q HS Inpatient Diabetes Program Recommendations:   Consider reducing Novolog correction to sensitive tid with meals.   Thanks,  Beryl Meager, RN, BC-ADM Inpatient Diabetes Coordinator Pager [phone] (8a-5p)

## 2017-08-30 NOTE — Progress Notes (Signed)
Called patient's brother Jenina Moening and discussed about her hip fractures, her baseline dementia with multiple medical problems poor surgical candidate.  Continue IV fluids and palliative care consulted, CODE STATUS DNR.  For hyperkalemia patient received insulin with dextrose, bicarb, Kayexalate.  Recheck Chem-7 again.

## 2017-08-30 NOTE — Consult Note (Signed)
ORTHOPAEDIC CONSULTATION  REQUESTING PHYSICIAN: Katha Hamming, MD  Chief Complaint: Right hip and left knee pain  HPI: Stephanie Willis is a 84 y.o. female who complains of right hip and left knee pain.  She is a 84 year old female with end-stage dementia and multiple severe medical problems.  Her nursing home reported that she was complaining of pain for 5 days and was brought to the emergency room at [facility] yesterday.  I am x-rays and T CT scans that revealed a mildly impacted right subcapital hip fracture and a nondisplaced distal left femoral supracondylar fracture.  She was noted to have extreme osteoporosis.  She was placed in a knee immobilizer for the left leg and admitted for evaluation.  Admission her potassium was high at 7.0 and her hemoglobin was very low at 7.3.  The hospitalist felt that she was not a good candidate for any surgery.  Orthopedic consult was requested for recommendations.  Past Medical History:  Diagnosis Date  . Anemia   . Chronic kidney disease   . Dementia   . Depression   . Diabetes mellitus without complication (HCC)   . GERD (gastroesophageal reflux disease)   . Hypertension   . Myocardial infarction (HCC)   . Stroke [facility])    Past Surgical History:  Procedure Laterality Date  . CHOLECYSTECTOMY     Social History   Socioeconomic History  . Marital status: Single    Spouse name: Not on file  . Number of children: Not on file  . Years of education: Not on file  . Highest education level: Not on file  Occupational History  . Not on file  Social Needs  . Financial resource strain: Not on file  . Food insecurity:    Worry: Not on file    Inability: Not on file  . Transportation needs:    Medical: Not on file    Non-medical: Not on file  Tobacco Use  . Smoking status: Never Smoker  . Smokeless tobacco: Never Used  Substance and Sexual Activity  . Alcohol use: Not Currently  . Drug use: Not on file  . Sexual activity: Not on file   Lifestyle  . Physical activity:    Days per week: Not on file    Minutes per session: Not on file  . Stress: Not on file  Relationships  . Social connections:    Talks on phone: Not on file    Gets together: Not on file    Attends religious service: Not on file    Active member of club or organization: Not on file    Attends meetings of clubs or organizations: Not on file    Relationship status: Not on file  Other Topics Concern  . Not on file  Social History Narrative  . Not on file   Family History  Family history unknown: Yes   Allergies  Allergen Reactions  . Penicillins Other (See Comments)    Reaction:  Unknown    Prior to Admission medications   Medication Sig Start Date End Date Taking? Authorizing Provider  acetaminophen (TYLENOL) 500 MG tablet Take 1,000 mg by mouth every 6 (six) hours as needed for mild pain.    Yes [provider]  amLODipine (NORVASC) 10 MG tablet Take 1 tablet (10 mg total) by mouth daily. 08/07/15  Yes Sudini, Wardell Heath, MD  docusate sodium (COLACE) 100 MG capsule Take 100 mg by mouth daily as needed for mild constipation.    Yes [provider]  donepezil (ARICEPT) 10 MG tablet Take 10 mg by mouth at bedtime.   Yes [provider]  ferrous sulfate 325 (65 FE) MG tablet Take 325 mg by mouth 3 (three) times daily with meals.   Yes [provider]  glucagon (GLUCAGEN) 1 MG SOLR injection Inject 1 mg into the muscle once as needed for low blood sugar.   Yes [provider]  guaiFENesin (ROBITUSSIN) 100 MG/5ML liquid Take 200 mg by mouth 4 (four) times daily as needed for cough.   Yes [provider]  hydrALAZINE (APRESOLINE) 100 MG tablet Take 1 tablet (100 mg total) by mouth 3 (three) times daily. 08/07/15  Yes Sudini, Wardell Heath, MD  insulin aspart (NOVOLOG) 100 UNIT/ML FlexPen Inject 2-8 Units into the skin 4 (four) times daily -  before meals and at bedtime. SSI   Yes [provider]  insulin  glargine (LANTUS) 100 UNIT/ML injection Inject 6 Units into the skin at bedtime.   Yes [provider]  ipratropium-albuterol (DUONEB) 0.5-2.5 (3) MG/3ML SOLN Take 3 mLs by nebulization every 4 (four) hours as needed (shortness of breath).   Yes [provider]  lactobacillus acidophilus (BACID) TABS tablet Take 1 tablet by mouth 2 (two) times daily.    Yes [provider]  lamoTRIgine (LAMICTAL) 25 MG tablet Take 37.5 mg by mouth at bedtime.   Yes [provider]  LORazepam (ATIVAN) 0.5 MG tablet Take 1 tablet (0.5 mg total) by mouth 2 (two) times daily as needed for anxiety. 08/07/15  Yes Sudini, Wardell Heath, MD  magnesium oxide (MAG-OX) 400 (241.3 Mg) MG tablet Take 400 mg by mouth daily.   Yes [provider]  metoprolol tartrate (LOPRESSOR) 25 MG tablet Take 0.5 tablets (12.5 mg total) by mouth 2 (two) times daily. 08/07/15  Yes Sudini, Wardell Heath, MD  mirtazapine (REMERON) 15 MG tablet Take 15 mg by mouth at bedtime.   Yes [provider]  Multiple Vitamin (MULTIVITAMIN) LIQD Take 15 mLs by mouth daily.   Yes [provider]  omeprazole (PRILOSEC) 20 MG capsule Take 20 mg by mouth daily.   Yes [provider]  oxyCODONE (OXY IR/ROXICODONE) 5 MG immediate release tablet Take 2.5 mg by mouth every 12 (twelve) hours as needed for moderate pain or severe pain.   Yes [provider]  senna-docusate (SENOKOT-S) 8.6-50 MG tablet Take 1 tablet by mouth daily.   Yes [provider]  simethicone (MYLICON) 80 MG chewable tablet Chew 160 mg by mouth 3 (three) times daily.   Yes [provider]  ciprofloxacin (CIPRO) 250 MG tablet Take 1 tablet (250 mg total) by mouth 2 (two) times daily. Patient not taking: Reported on 09/27/2015 08/07/15   Milagros Loll, MD   Ct Knee Left Wo Contrast  Result Date: 08/30/2017 CLINICAL DATA:  Left knee pain after injury 5 days ago. EXAM: CT OF THE LEFT KNEE WITHOUT CONTRAST TECHNIQUE:  Multidetector CT imaging of the LEFT knee was performed according to the standard protocol. Multiplanar CT image reconstructions were also generated. COMPARISON:  Same day radiographs of the knee. FINDINGS: Bones/Joint/Cartilage The bones are markedly osteopenic limiting fracture assessment. Subtle transverse fracture of the distal femoral metaphysis, series 6/29 is identified resulting in slight dorsal angulation of the femoral condyles, series 10/27. This fracture exits the medial and lateral supracondylar cortex, series 6/38 laterally and also medially for example. No definite intra-articular extension is seen. There is a moderate suprapatellar joint effusion without fat fluid level both small hematocrit  level along its caudal aspect, series 10/30. Marked femorotibial and patellofemoral joint space narrowing and spurring is seen. The proximal tibia and fibula are intact. Ligaments Suboptimally assessed by CT. Muscles and Tendons Generalized muscle atrophy in keeping with patient's age. No fatty replacement. Extensor mechanism tendons are intact. Soft tissues No significant soft tissue swelling. IMPRESSION: Supracondylar fracture of the distal femur with slight dorsal angulation of the femoral condyles. Associated moderate suprapatellar joint effusion with trace hematocrit level. Tricompartmental osteoarthritis of the knee. Electronically Signed   By: Tollie Eth M.D.   On: 08/30/2017 02:04   Ct Hip Right Wo Contrast  Result Date: 08/30/2017 CLINICAL DATA:  Right hip pain after remote fall. EXAM: CT OF THE RIGHT HIP WITHOUT CONTRAST TECHNIQUE: Multidetector CT imaging of the right hip was performed according to the standard protocol. Multiplanar CT image reconstructions were also generated. COMPARISON:  Same day radiographs of the right hip. FINDINGS: Images of the left knee were also included as part of this study. Please see the separate left knee report for further details. Bones/Joint/Cartilage Minimally  impacted appearing superolateral subcapital impaction fracture of the right femoral neck on femoral head, series 6/41 through 44. No parasymphyseal or pubic rami fracture. Acetabulum appears intact. No joint dislocation. Physiologic fluid in the right hip joint. No intra-articular loose bodies. Ligaments Suboptimally assessed by CT. Muscles and Tendons Global atrophy about the right hip. Soft tissues Mild soft tissue anasarca. No soft tissue hematoma. Dense atherosclerotic calcifications along the course of the right external iliac, common femoral and femoral arteries. IMPRESSION: Findings suspicious for subtle impaction fracture of the superolateral femoral head-neck junction. No joint dislocation. Intact pubic rami and acetabulum. No parasymphyseal fracture. Electronically Signed   By: Tollie Eth M.D.   On: 08/30/2017 01:50   Dg Hip Unilat W Or Wo Pelvis 2-3 Views Right  Result Date: 08/30/2017 CLINICAL DATA:  Right leg injury 5 days ago. EXAM: DG HIP (WITH OR WITHOUT PELVIS) 2-3V RIGHT COMPARISON:  09/27/2015 FINDINGS: Diffuse bone demineralization. Patient rotation limits evaluation. There is mild sclerosis along the base of the right femoral head with slight cortical angulation laterally. This may indicate an impacted fracture of the subcapital region. Visualized pelvis appears intact. Degenerative changes in the lower lumbar spine and hips. Extensive vascular calcifications. Surgical clips in the right upper quadrant. IMPRESSION: Linear sclerosis and slight cortical irregularity along the subcapital right femoral neck may indicate an impacted fracture. No dislocation. Electronically Signed   By: Burman Nieves M.D.   On: 08/30/2017 00:26   Dg Femur Min 2 Views Left  Result Date: 08/30/2017 CLINICAL DATA:  Pain after fall injury 5 days ago. EXAM: LEFT FEMUR 2 VIEWS COMPARISON:  None. FINDINGS: Comminuted appearing distal femoral fracture with subtle transverse component across the metaphysis and  sagittal component along the periphery of the lateral femoral condyle. 2 mm of lateral displacement of the lateral femoral condylar fracture fragment is noted. Moderate suprapatellar joint effusion. No joint dislocation. Left hip is intact. Extensive vascular calcifications are identified along the course of the femoral and popliteal as well as tibial arteries. IMPRESSION: 1. Acute comminuted fracture of the distal femur with transverse component across the metaphysis and sagittal component involving the lateral femoral condyle. Extension of fracture into the knee joint is not confirmed on images provided. Moderate joint effusion is present. 2. Osteoarthritis of the femorotibial and patellofemoral compartment. Electronically Signed   By: Tollie Eth M.D.   On: 08/30/2017 00:45   Dg Femur Min 2  Views Right  Result Date: 08/30/2017 CLINICAL DATA:  Right leg injury 5 days ago. EXAM: RIGHT FEMUR 2 VIEWS COMPARISON:  Pelvis and right hip radiographs from the same day and pelvic radiographs from 09/27/2015 FINDINGS: Subtle lucency about the right parasymphysis is suspicious for nondisplaced fracture versus skin fold artifact. The study is limited due to overlying skin folds and osteopenic appearance of the bones. Femoroacetabular impingement morphology of the femoral head with broadening of the femoral head-neck junction superolaterally. Probable rim of osteophytes at the femoral head-neck junction. Slight cortical angulation along the superolateral femoral neck cannot exclude a fracture. No hip joint dislocation. Shallow right acetabulum likely reflecting remote developmental dysplasia of the hip. No suspicious osseous lesions. Marked osteoarthritis with chondrocalcinosis of the femorotibial compartment. Moderate joint space narrowing of the femorotibial compartment. No knee joint effusion. Extensive arterial calcifications are noted of the right thigh and knee. IMPRESSION: 1. Subtle lucency about the right  parasymphysis could either be due to skin fold artifacts or nondisplaced fractures. Based on the right hip radiographs, suspect this is artifactual. Similarly, subtle cortical irregularity along the superolateral aspect of the femoral head could potentially represent a nondisplaced fracture with adjacent rim of osteophytes at the femoral head-neck junction. CT may help for further correlation. 2. Femoroacetabular impingement morphology of the femoral head and neck. No hip joint dislocation. 3. Shallow appearing right acetabulum which may reflect developmental dysplasia of the hip. 4. Osteoarthritis of the right knee. Electronically Signed   By: Tollie Eth M.D.   On: 08/30/2017 00:37    Positive ROS: All other systems have been reviewed and were otherwise negative with the exception of those mentioned in the HPI and as above.  Physical Exam: General: Alert, no acute distress Cardiovascular: No pedal edema Respiratory: No cyanosis, no use of accessory musculature GI: No organomegaly, abdomen is soft and non-tender Skin: No lesions in the area of chief complaint Neurologic: Sensation intact distally Psychiatric: Patient is competent for consent with normal mood and affect Lymphatic: No axillary or cervical lymphadenopathy  MUSCULOSKELETAL: Patient does not respond to questioning.  She will not allow me to extend her hips or knees.  There is minimal swelling in the left knee region.  She has pain with movement of the left knee and the right hip.  Skin is intact.  Neurovascular status seems intact.  Assessment: Impacted right subcapital hip fracture and nondisplaced left distal femur fracture Advanced dementia and multiple severe medical problems  Plan: Per the hospitalist service we will proceed with conservative,  nonoperative treatment in this patient.  Recommend knee immobilizer for the left leg.  Agree with pursuing hospice/palliative care.  I will be glad to readdress this in the future  it your request.  Valinda Hoar, MD [phone]   08/30/2017 5:04 PM

## 2017-08-30 NOTE — Clinical Social Work Note (Signed)
CSW received consult that patient may need SNF placement, CSW awaiting PT recommendations.  Stephanie Willis. Hassan Rowan, MSW, Theresia Majors [phone]  08/30/2017 6:43 PM

## 2017-08-31 DIAGNOSIS — E875 Hyperkalemia: Secondary | ICD-10-CM

## 2017-08-31 DIAGNOSIS — S72001A Fracture of unspecified part of neck of right femur, initial encounter for closed fracture: Secondary | ICD-10-CM

## 2017-08-31 DIAGNOSIS — S72492A Other fracture of lower end of left femur, initial encounter for closed fracture: Secondary | ICD-10-CM

## 2017-08-31 DIAGNOSIS — N179 Acute kidney failure, unspecified: Secondary | ICD-10-CM

## 2017-08-31 LAB — HEMOGLOBIN AND HEMATOCRIT, BLOOD
HCT: 22.4 % — ABNORMAL LOW (ref 35.0–47.0)
HEMOGLOBIN: 7.5 g/dL — AB (ref 12.0–16.0)

## 2017-08-31 LAB — PREALBUMIN: Prealbumin: 18.4 mg/dL (ref 18–38)

## 2017-08-31 LAB — BASIC METABOLIC PANEL
ANION GAP: 4 — AB (ref 5–15)
BUN: 48 mg/dL — ABNORMAL HIGH (ref 6–20)
CO2: 24 mmol/L (ref 22–32)
Calcium: 7.9 mg/dL — ABNORMAL LOW (ref 8.9–10.3)
Chloride: 113 mmol/L — ABNORMAL HIGH (ref 101–111)
Creatinine, Ser: 1.96 mg/dL — ABNORMAL HIGH (ref 0.44–1.00)
GFR calc non Af Amer: 21 mL/min — ABNORMAL LOW (ref >60)
GFR, EST AFRICAN AMERICAN: 24 mL/min — AB (ref >60)
Glucose, Bld: 111 mg/dL — ABNORMAL HIGH (ref 65–99)
POTASSIUM: 5.6 mmol/L — AB (ref 3.5–5.1)
SODIUM: 141 mmol/L (ref 135–145)

## 2017-08-31 LAB — GLUCOSE, CAPILLARY
GLUCOSE-CAPILLARY: 143 mg/dL — AB (ref 65–99)
Glucose-Capillary: 107 mg/dL — ABNORMAL HIGH (ref 65–99)
Glucose-Capillary: 209 mg/dL — ABNORMAL HIGH (ref 65–99)
Glucose-Capillary: 200 mg/dL — ABNORMAL HIGH (ref 65–99)

## 2017-08-31 LAB — HEMOGLOBIN: Hemoglobin: 6.7 g/dL — ABNORMAL LOW (ref 12.0–16.0)

## 2017-08-31 MED ORDER — MORPHINE SULFATE (PF) 2 MG/ML IV SOLN
2.0000 mg | INTRAVENOUS | Status: DC | PRN
  Administered 2017-08-31 – 2017-09-02 (×6): 2 mg via INTRAVENOUS
  Filled 2017-08-31 (×6): qty 1

## 2017-08-31 MED ORDER — PATIROMER SORBITEX CALCIUM 8.4 G PO PACK
8.4000 g | PACK | Freq: Every day | ORAL | Status: DC
  Administered 2017-08-31: 8.4 g via ORAL
  Filled 2017-08-31 (×4): qty 4

## 2017-08-31 MED ORDER — HEPARIN SODIUM (PORCINE) 5000 UNIT/ML IJ SOLN
5000.0000 [IU] | Freq: Two times a day (BID) | INTRAMUSCULAR | Status: DC
  Administered 2017-08-31 – 2017-09-01 (×3): 5000 [IU] via SUBCUTANEOUS
  Filled 2017-08-31 (×4): qty 1

## 2017-08-31 MED ORDER — HYDRALAZINE HCL 20 MG/ML IJ SOLN
10.0000 mg | Freq: Four times a day (QID) | INTRAMUSCULAR | Status: DC | PRN
  Administered 2017-08-31 – 2017-09-02 (×3): 10 mg via INTRAVENOUS
  Filled 2017-08-31 (×3): qty 1

## 2017-08-31 MED ORDER — INSULIN ASPART 100 UNIT/ML ~~LOC~~ SOLN
0.0000 [IU] | Freq: Three times a day (TID) | SUBCUTANEOUS | Status: DC
  Administered 2017-08-31 – 2017-09-01 (×2): 3 [IU] via SUBCUTANEOUS
  Administered 2017-09-01 – 2017-09-02 (×2): 1 [IU] via SUBCUTANEOUS
  Filled 2017-08-31 (×3): qty 1

## 2017-08-31 MED ORDER — SODIUM CHLORIDE 0.9 % IV SOLN
Freq: Once | INTRAVENOUS | Status: DC
Start: 2017-08-31 — End: 2017-09-02

## 2017-08-31 NOTE — Progress Notes (Signed)
Patient was unable to finish medicine (oral) for reducing potassium.  Patient keeps begging God for death "Please Lord God Jesus let me die... I am in so much pain" Doctor is going to discuss comfort care with her brother, but has been unable to reach him.  Morphine was given for pain but it did not work well.  Patient reports high levels of pain even after morphine.  Christean Grief, RN

## 2017-08-31 NOTE — Progress Notes (Signed)
OT Cancellation Note  Patient Details Name: Stephanie Willis MRN: [id number] DOB: 05/26/1921   Cancelled Treatment:    Reason Eval/Treat Not Completed: Medical issues which prohibited therapy. Order received, chart reviewed. Pt noted with K 5.6, HgB 6.7, and HCT 22.7 all outside guidelines for participation with OT services.  Will hold patient this date and attempt OT evaluation at a future date as medically appropriate.   Richrd Prime, MPH, MS, OTR/L ascom [phone] 08/31/17, 8:44 AM

## 2017-08-31 NOTE — Progress Notes (Signed)
[facility] - [place] [facility]   PATIENT NAME: Stephanie Willis    MR#:  [id number]  DATE OF BIRTH:  07-27-1921  SUBJECTIVE: Fairly demented patient admitted because of fall, bilateral hip fractures.  Patient had a fall 5 days ago at [facility] health care.  Found to have severe hyperkalemia received multiple doses of insulin, dextrose, sodium bicarb, Kayexalate.  Patient started on Veltassa, potassium improved.  CHIEF COMPLAINT:   Chief Complaint  Patient presents with  . Leg Pain    REVIEW OF SYSTEMS:   Review of Systems  Unable to perform ROS: Dementia   .   DRUG ALLERGIES:   Allergies  Allergen Reactions  . Penicillins Other (See Comments)    Reaction:  Unknown     VITALS:  Blood pressure (!) 174/55, pulse (!) 56, temperature 98.3 F (36.8 C), temperature source Oral, resp. rate 14, height 5' (1.524 m), weight 44.9 kg (99 lb), SpO2 100 %.  PHYSICAL EXAMINATION:  GENERAL:  84 y.o.-year-old patient lying in the bed, baseline dementia, EYES: Pupils equal, round, reactive to light HEENT: Head atraumatic, normocephalic. Oropharynx and nasopharynx clear.  NECK:  Supple, no jugular venous distention. No thyroid enlargement, no tenderness.  LUNGS: Normal breath sounds bilaterally, no wheezing, rales,rhonchi or crepitation. No use of accessory muscles of respiration.  CARDIOVASCULAR: S1, S2 normal. No murmurs, rubs, or gallops.  ABDOMEN: Soft, nontender, nondistended. Bowel sounds present. No organomegaly or mass.  EXTREMITIES: No pedal edema, cyanosis, or clubbing.  NEUROLOGIC:  severe dementia unable to do neurological exam because of her severe dementia   pSYCHIATRIC:  severe dementia.   SKIN: No obvious rash, lesion, or ulcer.    LABORATORY PANEL:   CBC Recent Labs  Lab 08/30/17 0043 08/31/17 0512  WBC 4.1  --   HGB 7.3* 6.7*  HCT 22.7*  --   PLT 210  --     ------------------------------------------------------------------------------------------------------------------  Chemistries  Recent Labs  Lab 08/30/17 0043  08/31/17 0738  NA 141   < > 141  K 7.0*   < > 5.6*  CL 116*   < > 113*  CO2 22   < > 24  GLUCOSE 208*   < > 111*  BUN 52*   < > 48*  CREATININE 2.14*   < > 1.96*  CALCIUM 8.2*   < > 7.9*  AST 10*  --   --   ALT 11*  --   --   ALKPHOS 67  --   --   BILITOT 0.2*  --   --    < > = values in this interval not displayed.   ------------------------------------------------------------------------------------------------------------------  Cardiac Enzymes Recent Labs  Lab 08/30/17 0043  TROPONINI 0.03*   ------------------------------------------------------------------------------------------------------------------  RADIOLOGY:  Ct Knee Left Wo Contrast  Result Date: 08/30/2017 CLINICAL DATA:  Left knee pain after injury 5 days ago. EXAM: CT OF THE LEFT KNEE WITHOUT CONTRAST TECHNIQUE: Multidetector CT imaging of the LEFT knee was performed according to the standard protocol. Multiplanar CT image reconstructions were also generated. COMPARISON:  Same day radiographs of the knee. FINDINGS: Bones/Joint/Cartilage The bones are markedly osteopenic limiting fracture assessment. Subtle transverse fracture of the distal femoral metaphysis, series 6/29 is identified resulting in slight dorsal angulation of the femoral condyles, series 10/27. This fracture exits the medial and lateral supracondylar cortex, series 6/38 laterally and also medially for example. No definite intra-articular extension is seen. There is a moderate suprapatellar joint effusion without fat fluid level both small  hematocrit level along its caudal aspect, series 10/30. Marked femorotibial and patellofemoral joint space narrowing and spurring is seen. The proximal tibia and fibula are intact. Ligaments Suboptimally assessed by CT. Muscles and Tendons Generalized  muscle atrophy in keeping with patient's age. No fatty replacement. Extensor mechanism tendons are intact. Soft tissues No significant soft tissue swelling. IMPRESSION: Supracondylar fracture of the distal femur with slight dorsal angulation of the femoral condyles. Associated moderate suprapatellar joint effusion with trace hematocrit level. Tricompartmental osteoarthritis of the knee. Electronically Signed   By: Tollie Eth M.D.   On: 08/30/2017 02:04   Ct Hip Right Wo Contrast  Result Date: 08/30/2017 CLINICAL DATA:  Right hip pain after remote fall. EXAM: CT OF THE RIGHT HIP WITHOUT CONTRAST TECHNIQUE: Multidetector CT imaging of the right hip was performed according to the standard protocol. Multiplanar CT image reconstructions were also generated. COMPARISON:  Same day radiographs of the right hip. FINDINGS: Images of the left knee were also included as part of this study. Please see the separate left knee report for further details. Bones/Joint/Cartilage Minimally impacted appearing superolateral subcapital impaction fracture of the right femoral neck on femoral head, series 6/41 through 44. No parasymphyseal or pubic rami fracture. Acetabulum appears intact. No joint dislocation. Physiologic fluid in the right hip joint. No intra-articular loose bodies. Ligaments Suboptimally assessed by CT. Muscles and Tendons Global atrophy about the right hip. Soft tissues Mild soft tissue anasarca. No soft tissue hematoma. Dense atherosclerotic calcifications along the course of the right external iliac, common femoral and femoral arteries. IMPRESSION: Findings suspicious for subtle impaction fracture of the superolateral femoral head-neck junction. No joint dislocation. Intact pubic rami and acetabulum. No parasymphyseal fracture. Electronically Signed   By: Tollie Eth M.D.   On: 08/30/2017 01:50   Dg Hip Unilat W Or Wo Pelvis 2-3 Views Right  Result Date: 08/30/2017 CLINICAL DATA:  Right leg injury 5 days ago.  EXAM: DG HIP (WITH OR WITHOUT PELVIS) 2-3V RIGHT COMPARISON:  09/27/2015 FINDINGS: Diffuse bone demineralization. Patient rotation limits evaluation. There is mild sclerosis along the base of the right femoral head with slight cortical angulation laterally. This may indicate an impacted fracture of the subcapital region. Visualized pelvis appears intact. Degenerative changes in the lower lumbar spine and hips. Extensive vascular calcifications. Surgical clips in the right upper quadrant. IMPRESSION: Linear sclerosis and slight cortical irregularity along the subcapital right femoral neck may indicate an impacted fracture. No dislocation. Electronically Signed   By: Burman Nieves M.D.   On: 08/30/2017 00:26   Dg Femur Min 2 Views Left  Result Date: 08/30/2017 CLINICAL DATA:  Pain after fall injury 5 days ago. EXAM: LEFT FEMUR 2 VIEWS COMPARISON:  None. FINDINGS: Comminuted appearing distal femoral fracture with subtle transverse component across the metaphysis and sagittal component along the periphery of the lateral femoral condyle. 2 mm of lateral displacement of the lateral femoral condylar fracture fragment is noted. Moderate suprapatellar joint effusion. No joint dislocation. Left hip is intact. Extensive vascular calcifications are identified along the course of the femoral and popliteal as well as tibial arteries. IMPRESSION: 1. Acute comminuted fracture of the distal femur with transverse component across the metaphysis and sagittal component involving the lateral femoral condyle. Extension of fracture into the knee joint is not confirmed on images provided. Moderate joint effusion is present. 2. Osteoarthritis of the femorotibial and patellofemoral compartment. Electronically Signed   By: Tollie Eth M.D.   On: 08/30/2017 00:45   Dg Femur Min  2 Views Right  Result Date: 08/30/2017 CLINICAL DATA:  Right leg injury 5 days ago. EXAM: RIGHT FEMUR 2 VIEWS COMPARISON:  Pelvis and right hip radiographs  from the same day and pelvic radiographs from 09/27/2015 FINDINGS: Subtle lucency about the right parasymphysis is suspicious for nondisplaced fracture versus skin fold artifact. The study is limited due to overlying skin folds and osteopenic appearance of the bones. Femoroacetabular impingement morphology of the femoral head with broadening of the femoral head-neck junction superolaterally. Probable rim of osteophytes at the femoral head-neck junction. Slight cortical angulation along the superolateral femoral neck cannot exclude a fracture. No hip joint dislocation. Shallow right acetabulum likely reflecting remote developmental dysplasia of the hip. No suspicious osseous lesions. Marked osteoarthritis with chondrocalcinosis of the femorotibial compartment. Moderate joint space narrowing of the femorotibial compartment. No knee joint effusion. Extensive arterial calcifications are noted of the right thigh and knee. IMPRESSION: 1. Subtle lucency about the right parasymphysis could either be due to skin fold artifacts or nondisplaced fractures. Based on the right hip radiographs, suspect this is artifactual. Similarly, subtle cortical irregularity along the superolateral aspect of the femoral head could potentially represent a nondisplaced fracture with adjacent rim of osteophytes at the femoral head-neck junction. CT may help for further correlation. 2. Femoroacetabular impingement morphology of the femoral head and neck. No hip joint dislocation. 3. Shallow appearing right acetabulum which may reflect developmental dysplasia of the hip. 4. Osteoarthritis of the right knee. Electronically Signed   By: Tollie Eth M.D.   On: 08/30/2017 00:37    EKG:   Orders placed or performed during the hospital encounter of 08/29/17  . ED EKG  . ED EKG    ASSESSMENT AND PLAN:   Impacted right hip, left distal femur fractures.  Patient has severe advanced dementia, multiple medical problems, nonoperative management  recommended secondary to multiple medical problems, continue the immobilizer for the left leg, pain medicines with IV morphine.  Patient will be on DVT prophylaxis. 2.  Acute on chronic anemia likely due to dilution and also due to anemia of chronic disease: Hemoglobin seven 6.7 today, transfuse 1 unit of packed RBC.  Continue heparin for DVT prophylaxis. 3.  Severe hyperkalemia required multiple doses of insulin with dextrose, bicarb, Kayexalate, Veltassa, potassium  decreased from 7-5.6.  Continue Veltassa. 4.  Acute renal failure secondary to poor p.o. intake and advanced dementia, received gentle hydration, creatinine decreased from 2.14-1.96. 5.  Prognosis really poor due to multiple medical problems, and severe advanced dementia, recommend comfort care measures, palliative care is following unfortunately the brother who is only relative that she has he is not reachable.  I will try to call him again.  #6  severe malnutrition #7 diabetes-type II, poor p.o. intake, blood sugar is around 143.  On very low-dose Lantus, continue insulin sliding scale coverage with sensitive scale.   All the records are reviewed and case discussed with Care Management/Social Workerr. Management plans discussed with the patient, family and they are in agreement.  CODE STATUS: DNR  TOTAL TIME TAKING CARE OF THIS PATIENT: .   POSSIBLE D/C IN 1-2DAYS, DEPENDING ON CLINICAL CONDITION.   Katha Hamming M.D on 08/31/2017 at 1:18 PM  Between 7am to 6pm - Pager - [phone]  After 6pm go to [email or web address] - password [email or web address] [facility]  [facility]  Office  [phone]  CC: Primary care physician; Dimple Casey, MD   Note: This dictation was prepared with Dragon dictation along with smaller phrase technology.  Any transcriptional errors that result from this process are unintentional.

## 2017-08-31 NOTE — Progress Notes (Signed)
PT Cancellation Note  Patient Details Name: Stephanie Willis MRN: [id number] DOB: [birth year]   Cancelled Treatment:    Reason Eval/Treat Not Completed: Medical issues which prohibited therapy; Pt's Ka 5.6, HgB 6.7, and HCT 22.7 all outside guidelines for participation with PT services.  Will hold patient this date and attempt PT evaluation at a future date as medically appropriate.    Ovidio Hanger PT, DPT 08/31/17, 8:40 AM

## 2017-08-31 NOTE — Clinical Social Work Note (Addendum)
Clinical Social Work Assessment  Patient Details  Name: Stephanie Willis MRN: [id number] Date of Birth: May 11, 1921  Date of referral:  08/31/17               Reason for consult:  Facility Placement, Discharge Planning                Permission sought to share information with:  Oceanographer granted to share information::  Yes, Verbal Permission Granted  Name::      [facility]  Agency::   [place]  Relationship::     Contact Information:     Housing/Transportation Living arrangements for the past 2 months:  Skilled Building surveyor of Information:  Facility Patient Interpreter Needed:  None Criminal Activity/Legal Involvement Pertinent to Current Situation/Hospitalization:  No - Comment as needed Significant Relationships:  Spouse Lives with:  Facility Resident Do you feel safe going back to the place where you live?  Yes Need for family participation in patient care:  Yes (Comment)  Care giving concerns: Patient is a long-term care resident at [facility].   Social Worker assessment / plan: Visual merchandiser (CSW) received SNF consult. Patient is a long-term care resident from [facility]. Social work Tax inspector attempted to meet with patient but she was oriented only to self and could not participate in assessment. Social work Tax inspector attempted to contact patient's brother Alinda Money (cell: [phone] and home: [phone]) and voicemail's could not be left. CSW contacted [facility] admission's coordinator Tresa Endo, who confirmed that patient was a long-term care resident. Per Tresa Endo, patient can return once stable for discharge. FL2 completed. CSW and social work Tax inspector will continue to follow up and assist.   Employment status:  Retired Health and safety inspector:  Medicare PT Recommendations:  Not assessed at this time Information / Referral to community resources:     Patient/Family's Response to care: Patient  will return to [facility] once stable for discharge.  Patient/Family's Understanding of and Emotional Response to Diagnosis, Current Treatment, and Prognosis: Patient was pleasant.  Emotional Assessment Appearance:  Appears stated age Attitude/Demeanor/Rapport:    Affect (typically observed):  Calm, Pleasant Orientation:  Oriented to Self Alcohol / Substance use:  Not Applicable Psych involvement (Current and /or in the community):  No (Comment)  Discharge Needs  Concerns to be addressed:  Care Coordination, Discharge Planning Concerns Readmission within the last 30 days:  No Current discharge risk:  Dependent with Mobility Barriers to Discharge:  Continued Medical Work up   Payton Spark, Student-Social Work 08/31/2017, 2:48 PM

## 2017-08-31 NOTE — Progress Notes (Signed)
Repeat hemoglobin 7.5.  Patient has severe dementia, unable to sign the consent.  Unable to reach the brother.  Hold off on blood transfusion at this time.

## 2017-08-31 NOTE — Progress Notes (Signed)
Patient was transferred to room 152 and switch to low bed.  Patient is screaming and incont of urine. Purewick placed. Sacral patch placed. Foam to right heel changed. Immobilizer to left leg. Bed alarm on for safety.

## 2017-08-31 NOTE — Consult Note (Addendum)
Consultation Note Date: 08/31/2017   Patient Name: Stephanie Willis  DOB: [birth year]  MRN: [id number]  Age / Sex: 84 y.o., female  PCP: Dimple Casey, MD Referring Physician: Katha Hamming, MD  Reason for Consultation: Establishing goals of care  HPI/Patient Profile: Stephanie Willis  is a 84 y.o. female with a known history of dementia (AAOx1), T2IDDM, HTN, HLD, CAD/MI, CVA, CKD, OAB, GERD, anemia, depression who p/w 1d Hx hip/leg pain. The scans were positive for a minimally impacted appearing superolateral subcapital impaction fracture of the right femoral neck on femoral head,  and a nondisplaced distal left femoral supracondylar fracture. Plans for nonop management. Anemia and electrolyte imbalance noted.   Clinical Assessment and Goals of Care: Patient resting in bedside chair. She does not know her name, where she is, or if she has any family. Attempted to reach her only listed contact Tamala Fothergill. The cell phone voicemail says it is full and cannot accept any new messages. The home phone number has the message that the number has been disconnected and is no longer in service.   Social work updated.     SUMMARY OF RECOMMENDATIONS   Message left for only contact listed, Tamala Fothergill.   Code Status/Advance Care Planning:  DNR    Symptom Management:   Per primary team.   Palliative Prophylaxis:   Eye Care and Oral Care  Prognosis:   Poor longterm. Right hip and left femur fx. Advanced dementia.   Discharge Planning: To Be Determined      Primary Diagnoses: Present on Admission: . Femur fracture (HCC)   I have reviewed the medical record, interviewed the patient and family, and examined the patient. The following aspects are pertinent.  Past Medical History:  Diagnosis Date  . Anemia   . Chronic kidney disease   . Dementia   . Depression   . Diabetes mellitus without  complication (HCC)   . GERD (gastroesophageal reflux disease)   . Hypertension   . Myocardial infarction (HCC)   . Stroke [facility])    Social History   Socioeconomic History  . Marital status: Single    Spouse name: Not on file  . Number of children: Not on file  . Years of education: Not on file  . Highest education level: Not on file  Occupational History  . Not on file  Social Needs  . Financial resource strain: Not on file  . Food insecurity:    Worry: Not on file    Inability: Not on file  . Transportation needs:    Medical: Not on file    Non-medical: Not on file  Tobacco Use  . Smoking status: Never Smoker  . Smokeless tobacco: Never Used  Substance and Sexual Activity  . Alcohol use: Not Currently  . Drug use: Not on file  . Sexual activity: Not on file  Lifestyle  . Physical activity:    Days per week: Not on file    Minutes per session: Not on file  . Stress: Not on file  Relationships  . Social connections:    Talks on phone: Not on file    Gets together: Not on file    Attends religious service: Not on file    Active member of club or organization: Not on file    Attends meetings of clubs or organizations: Not on file    Relationship status: Not on file  Other Topics Concern  . Not on file  Social History Narrative  . Not on file   Family History  Family history unknown: Yes   Scheduled Meds: . acidophilus  1 capsule Oral BID  . amLODipine  10 mg Oral Daily  . donepezil  10 mg Oral QHS  . feeding supplement (ENSURE ENLIVE)  237 mL Oral BID BM  . ferrous sulfate  325 mg Oral TID WC  . hydrALAZINE  100 mg Oral TID  . insulin aspart  0-15 Units Subcutaneous TID WC  . insulin glargine  6 Units Subcutaneous QHS  . lamoTRIgine  37.5 mg Oral QHS  . metoprolol tartrate  12.5 mg Oral BID  . mirtazapine  15 mg Oral QHS  . multivitamin  15 mL Oral Daily  . pantoprazole  40 mg Oral Daily  . simethicone  160 mg Oral TID   Continuous Infusions: . sodium  chloride 50 mL/hr at 08/31/17 0128   PRN Meds:.acetaminophen, bisacodyl, ipratropium-albuterol, LORazepam, morphine injection, ondansetron (ZOFRAN) IV, senna-docusate Medications Prior to Admission:  Prior to Admission medications   Medication Sig Start Date End Date Taking? Authorizing Provider  acetaminophen (TYLENOL) 500 MG tablet Take 1,000 mg by mouth every 6 (six) hours as needed for mild pain.    Yes [provider]  amLODipine (NORVASC) 10 MG tablet Take 1 tablet (10 mg total) by mouth daily. 08/07/15  Yes Sudini, Wardell Heath, MD  docusate sodium (COLACE) 100 MG capsule Take 100 mg by mouth daily as needed for mild constipation.    Yes [provider]  donepezil (ARICEPT) 10 MG tablet Take 10 mg by mouth at bedtime.   Yes [provider]  ferrous sulfate 325 (65 FE) MG tablet Take 325 mg by mouth 3 (three) times daily with meals.   Yes [provider]  glucagon (GLUCAGEN) 1 MG SOLR injection Inject 1 mg into the muscle once as needed for low blood sugar.   Yes [provider]  guaiFENesin (ROBITUSSIN) 100 MG/5ML liquid Take 200 mg by mouth 4 (four) times daily as needed for cough.   Yes [provider]  hydrALAZINE (APRESOLINE) 100 MG tablet Take 1 tablet (100 mg total) by mouth 3 (three) times daily. 08/07/15  Yes Sudini, Wardell Heath, MD  insulin aspart (NOVOLOG) 100 UNIT/ML FlexPen Inject 2-8 Units into the skin 4 (four) times daily -  before meals and at bedtime. SSI   Yes [provider]  insulin glargine (LANTUS) 100 UNIT/ML injection Inject 6 Units into the skin at bedtime.   Yes [provider]  ipratropium-albuterol (DUONEB) 0.5-2.5 (3) MG/3ML SOLN Take 3 mLs by nebulization every 4 (four) hours as needed (shortness of breath).   Yes [provider]  lactobacillus acidophilus (BACID) TABS tablet Take 1 tablet by mouth 2 (two) times daily.    Yes [provider]  lamoTRIgine (LAMICTAL) 25 MG tablet Take 37.5  mg by mouth at bedtime.   Yes [provider]  LORazepam (ATIVAN) 0.5 MG tablet Take 1 tablet (0.5 mg total) by mouth 2 (two) times daily as needed for anxiety. 08/07/15  Yes Sudini,  Srikar, MD  magnesium oxide (MAG-OX) 400 (241.3 Mg) MG tablet Take 400 mg by mouth daily.   Yes [provider]  metoprolol tartrate (LOPRESSOR) 25 MG tablet Take 0.5 tablets (12.5 mg total) by mouth 2 (two) times daily. 08/07/15  Yes Sudini, Wardell Heath, MD  mirtazapine (REMERON) 15 MG tablet Take 15 mg by mouth at bedtime.   Yes [provider]  Multiple Vitamin (MULTIVITAMIN) LIQD Take 15 mLs by mouth daily.   Yes [provider]  omeprazole (PRILOSEC) 20 MG capsule Take 20 mg by mouth daily.   Yes [provider]  oxyCODONE (OXY IR/ROXICODONE) 5 MG immediate release tablet Take 2.5 mg by mouth every 12 (twelve) hours as needed for moderate pain or severe pain.   Yes [provider]  senna-docusate (SENOKOT-S) 8.6-50 MG tablet Take 1 tablet by mouth daily.   Yes [provider]  simethicone (MYLICON) 80 MG chewable tablet Chew 160 mg by mouth 3 (three) times daily.   Yes [provider]  ciprofloxacin (CIPRO) 250 MG tablet Take 1 tablet (250 mg total) by mouth 2 (two) times daily. Patient not taking: Reported on 09/27/2015 08/07/15   Milagros Loll, MD   Allergies  Allergen Reactions  . Penicillins Other (See Comments)    Reaction:  Unknown    Review of Systems  Unable to perform ROS   Physical Exam  Constitutional: No distress.  Pulmonary/Chest: Effort normal.  Neurological: She is alert.  Skin: Skin is warm and dry.    Vital Signs: BP (!) 174/55   Pulse (!) 56   Temp 98.3 F (36.8 C) (Oral)   Resp 14   Ht 5' (1.524 m)   Wt 44.9 kg (99 lb)   SpO2 100%   BMI 19.33 kg/m  Pain Scale: PAINAD POSS *See Group Information*: S-Acceptable,Sleep, easy to arouse Pain Score: Asleep   SpO2: SpO2: 100 % O2 Device:SpO2: 100 % O2 Flow Rate: .     IO: Intake/output summary:   Intake/Output Summary (Last 24 hours) at 08/31/2017 1245 Last data filed at 08/31/2017 1020 Gross per 24 hour  Intake 827.5 ml  Output 0 ml  Net 827.5 ml    LBM: Last BM Date: 08/29/17 Baseline Weight: Weight: 45.4 kg (100 lb) Most recent weight: Weight: 44.9 kg (99 lb)     Palliative Assessment/Data:      Time In: 12:20 Time Out: 12:50 Time Total: 30 min Greater than 50%  of this time was spent counseling and coordinating care related to the above assessment and plan.  Signed by: Morton Stall, NP   Please contact Palliative Medicine Team phone at [phone] for questions and concerns.  For individual provider: See Loretha Stapler

## 2017-08-31 NOTE — [place] FL2 (Signed)
[place] MEDICAID FL2 LEVEL OF CARE SCREENING TOOL     IDENTIFICATION  Patient Name: Stephanie Willis Birthdate: Aug 07, 1921 Sex: female Admission Date (Current Location): 08/29/2017  [place] and [place] Number:  Chiropodist and Address:  [facility], [address], [place], [place] [address]      Provider Number: [id number]  Attending Physician Name and Address:  Katha Hamming, MD  Relative Name and Phone Number:       Current Level of Care: Hospital Recommended Level of Care: Skilled Nursing Facility Prior Approval Number:    Date Approved/Denied:   PASRR Number: [id number])  Discharge Plan: SNF    Current Diagnoses: Patient Active Problem List   Diagnosis Date Noted  . Femur fracture (HCC) 08/30/2017  . Protein-calorie malnutrition, severe 08/05/2015  . Sepsis (HCC) 08/03/2015  . UTI (lower urinary tract infection) 08/03/2015  . Delirium 08/03/2015  . Elevated troponin 08/03/2015    Orientation RESPIRATION BLADDER Height & Weight     Self  Normal Incontinent Weight: 99 lb (44.9 kg) Height:  5' (152.4 cm)  BEHAVIORAL SYMPTOMS/MOOD NEUROLOGICAL BOWEL NUTRITION STATUS      Continent Diet(Dysphagia 3)  AMBULATORY STATUS COMMUNICATION OF NEEDS Skin   Extensive Assist Verbally Normal                       Personal Care Assistance Level of Assistance  Bathing, Feeding, Dressing Bathing Assistance: Limited assistance Feeding assistance: Independent Dressing Assistance: Limited assistance     Functional Limitations Info  Sight, Hearing, Speech Sight Info: Adequate Hearing Info: Adequate Speech Info: Adequate    SPECIAL CARE FACTORS FREQUENCY  PT (By licensed PT), OT (By licensed OT)     PT Frequency: (5) OT Frequency: (5)            Contractures      Additional Factors Info  Code Status, Allergies Code Status Info: (DNR) Allergies Info: (PENICILLINS )           Current Medications  (08/31/2017):  This is the current hospital active medication list Current Facility-Administered Medications  Medication Dose Route Frequency Provider Last Rate Last Dose  . 0.45 % sodium chloride infusion   Intravenous Continuous Katha Hamming, MD 50 mL/hr at 08/31/17 0128    . 0.9 %  sodium chloride infusion   Intravenous Once Katha Hamming, MD      . acetaminophen (TYLENOL) tablet 650 mg  650 mg Oral Q6H PRN Barbaraann Rondo, MD      . acidophilus (RISAQUAD) capsule 1 capsule  1 capsule Oral BID Barbaraann Rondo, MD   1 capsule at 08/31/17 0847  . amLODipine (NORVASC) tablet 10 mg  10 mg Oral Daily Barbaraann Rondo, MD   10 mg at 08/31/17 0841  . bisacodyl (DULCOLAX) EC tablet 5 mg  5 mg Oral Daily PRN Barbaraann Rondo, MD      . donepezil (ARICEPT) tablet 10 mg  10 mg Oral QHS Sridharan, Prasanna, MD      . feeding supplement (ENSURE ENLIVE) (ENSURE ENLIVE) liquid 237 mL  237 mL Oral BID BM Katha Hamming, MD   237 mL at 08/31/17 1401  . ferrous sulfate tablet 325 mg  325 mg Oral TID WC Barbaraann Rondo, MD   325 mg at 08/30/17 1626  . heparin injection 5,000 Units  5,000 Units Subcutaneous Q12H Katha Hamming, MD      . hydrALAZINE (APRESOLINE) tablet 100 mg  100 mg Oral TID Barbaraann Rondo, MD  100 mg at 08/31/17 0838  . insulin aspart (novoLOG) injection 0-9 Units  0-9 Units Subcutaneous TID WC Katha Hamming, MD      . insulin glargine (LANTUS) injection 6 Units  6 Units Subcutaneous QHS Sridharan, Prasanna, MD      . ipratropium-albuterol (DUONEB) 0.5-2.5 (3) MG/3ML nebulizer solution 3 mL  3 mL Nebulization Q4H PRN Barbaraann Rondo, MD      . lamoTRIgine (LAMICTAL) tablet 37.5 mg  37.5 mg Oral QHS Marjie Skiff, Prasanna, MD      . LORazepam (ATIVAN) tablet 0.5 mg  0.5 mg Oral BID PRN Barbaraann Rondo, MD      . metoprolol tartrate (LOPRESSOR) tablet 12.5 mg  12.5 mg Oral BID Barbaraann Rondo, MD   Stopped at 08/31/17 1040  .  mirtazapine (REMERON) tablet 15 mg  15 mg Oral QHS Sridharan, Prasanna, MD      . morphine 2 MG/ML injection 2 mg  2 mg Intravenous Q2H PRN Katha Hamming, MD   2 mg at 08/31/17 1349  . multivitamin liquid 15 mL  15 mL Oral Daily Barbaraann Rondo, MD   15 mL at 08/31/17 0836  . ondansetron (ZOFRAN) injection 4 mg  4 mg Intravenous Q6H PRN Barbaraann Rondo, MD      . pantoprazole (PROTONIX) EC tablet 40 mg  40 mg Oral Daily Barbaraann Rondo, MD   40 mg at 08/31/17 0847  . patiromer Lelon Perla) packet 8.4 g  8.4 g Oral Daily Katha Hamming, MD   8.4 g at 08/31/17 1419  . senna-docusate (Senokot-S) tablet 1 tablet  1 tablet Oral QHS PRN Barbaraann Rondo, MD      . simethicone (MYLICON) chewable tablet 160 mg  160 mg Oral TID Barbaraann Rondo, MD   160 mg at 08/31/17 0848     Discharge Medications: Please see discharge summary for a list of discharge medications.  Relevant Imaging Results:  Relevant Lab Results:   Additional Information (SSN: [id number])  Payton Spark, Student-Social Work

## 2017-09-01 DIAGNOSIS — Z7189 Other specified counseling: Secondary | ICD-10-CM

## 2017-09-01 DIAGNOSIS — Z515 Encounter for palliative care: Secondary | ICD-10-CM

## 2017-09-01 LAB — GLUCOSE, CAPILLARY
GLUCOSE-CAPILLARY: 89 mg/dL (ref 65–99)
Glucose-Capillary: 218 mg/dL — ABNORMAL HIGH (ref 65–99)
Glucose-Capillary: 133 mg/dL — ABNORMAL HIGH (ref 65–99)
Glucose-Capillary: 177 mg/dL — ABNORMAL HIGH (ref 65–99)

## 2017-09-01 MED ORDER — LORAZEPAM 2 MG/ML IJ SOLN
0.5000 mg | Freq: Two times a day (BID) | INTRAMUSCULAR | Status: DC | PRN
  Administered 2017-09-01 – 2017-09-02 (×2): 0.5 mg via INTRAVENOUS
  Filled 2017-09-01 (×2): qty 1

## 2017-09-01 MED ORDER — ZIPRASIDONE MESYLATE 20 MG IM SOLR
20.0000 mg | Freq: Once | INTRAMUSCULAR | Status: AC
  Administered 2017-09-01: 20 mg via INTRAMUSCULAR
  Filled 2017-09-01: qty 20

## 2017-09-01 MED ORDER — HALOPERIDOL LACTATE 5 MG/ML IJ SOLN
2.0000 mg | Freq: Four times a day (QID) | INTRAMUSCULAR | Status: DC | PRN
  Administered 2017-09-01: 2 mg via INTRAVENOUS
  Filled 2017-09-01: qty 1

## 2017-09-01 NOTE — Progress Notes (Signed)
PT Cancellation Note  Patient Details Name: Stephanie Willis MRN: [id number] DOB: Mar 30, 1922   Cancelled Treatment:    Reason Eval/Treat Not Completed: Medical issues which prohibited therapy; Pt's Ka at 5.6 which is outside guidelines for participation with PT services.  Will attempt to see pt at at future date as medically appropriate.     Ovidio Hanger PT, DPT 09/01/17, 1:03 PM

## 2017-09-01 NOTE — Progress Notes (Signed)
OT Cancellation Note  Patient Details Name: Stephanie Willis MRN: [id number] DOB: 03/15/1922   Cancelled Treatment:    Reason Eval/Treat Not Completed: Medical issues which prohibited therapy. Pt's Ka last taken at 5.6 which is outside guidelines for participation with OT services.  Will attempt to see pt at at future date as medically appropriate.    Richrd Prime, MPH, MS, OTR/L ascom [phone] 09/01/17, 1:41 PM

## 2017-09-01 NOTE — Progress Notes (Signed)
Haldol given IV per MD order. Pt remains agitated, removing gown and yelling. Orders received. Will continue to monitor.

## 2017-09-01 NOTE — Progress Notes (Signed)
Please note patient currently followed by outpatient PALLIATIVE at [facility]. CSW [employer] made aware. Dayna Barker RN, BSN, [facility] [place], hospital Liaison [phone]

## 2017-09-01 NOTE — Progress Notes (Signed)
Rept to Dr. Luberta Mutter. Pt yelling out, agitated, removing gown and cardiac monitor. Orders received. Will continue to monitor.

## 2017-09-01 NOTE — Progress Notes (Addendum)
Daily Progress Note   Patient Name: Stephanie Willis       Date: 09/01/2017 DOB: 10-Sep-1921  Age: 84 y.o. MRN#: [id number] Attending Physician: Epifanio Lesches, MD Primary Care Physician: Donato Schultz, MD Admit Date: 08/29/2017  Reason for Consultation/Follow-up: Establishing goals of care  Subjective: Patient is confused per her baseline. She is unable to work with PT due to her hyperkalemia. AKI noted.   Attempted to reach [place] at the new work number provided, unable to reach him at that number. Cell phone mail box remains full.   12:51: Addendum: paged by nursing, family has just called requesting update. Attempted to call him again with no answer. RN notified.  3:20: Met with brother at bedside. He states he is her POA. Discussed her diagnoses and prognosis. He tells me he was told his father would not make it through the night and lived another 5 years.. He states "nobody can keep you here but God. I don't want her to die early. I don't want no black ball to rush her along. Do what you have to." He does confirm per current orders he would not want chest compressions, shocks, or a breathing tube if her heart and breathing stops.   Length of Stay: 2  Current Medications: Scheduled Meds:  . acidophilus  1 capsule Oral BID  . amLODipine  10 mg Oral Daily  . donepezil  10 mg Oral QHS  . feeding supplement (ENSURE ENLIVE)  237 mL Oral BID BM  . ferrous sulfate  325 mg Oral TID WC  . heparin injection (subcutaneous)  5,000 Units Subcutaneous Q12H  . hydrALAZINE  100 mg Oral TID  . insulin aspart  0-9 Units Subcutaneous TID WC  . insulin glargine  6 Units Subcutaneous QHS  . lamoTRIgine  37.5 mg Oral QHS  . metoprolol tartrate  12.5 mg Oral BID  . mirtazapine  15 mg Oral QHS  .  multivitamin  15 mL Oral Daily  . pantoprazole  40 mg Oral Daily  . patiromer  8.4 g Oral Daily  . simethicone  160 mg Oral TID  . ziprasidone  20 mg Intramuscular Once    Continuous Infusions: . sodium chloride 50 mL/hr at 08/31/17 0128  . sodium chloride      PRN Meds: acetaminophen, bisacodyl, haloperidol lactate, hydrALAZINE, ipratropium-albuterol,  LORazepam, morphine injection, ondansetron (ZOFRAN) IV, senna-docusate  Physical Exam  Constitutional: No distress.  Pulmonary/Chest: Effort normal.  Neurological: She is alert.  Skin: Skin is warm and dry.            Vital Signs: BP (!) 160/60 (BP Location: Right Arm)   Pulse 64   Temp 98.7 F (37.1 C) (Oral)   Resp 18   Ht 5' (1.524 m)   Wt 44.9 kg (99 lb)   SpO2 100%   BMI 19.33 kg/m  SpO2: SpO2: 100 % O2 Device: O2 Device: Room Air O2 Flow Rate:    Intake/output summary:   Intake/Output Summary (Last 24 hours) at 09/01/2017 1316 Last data filed at 09/01/2017 0654 Gross per 24 hour  Intake 395 ml  Output 650 ml  Net -255 ml   LBM: Last BM Date: 08/29/17 Baseline Weight: Weight: 45.4 kg (100 lb) Most recent weight: Weight: 44.9 kg (99 lb)       Palliative Assessment/Data: Unable to work with PT      Patient Active Problem List   Diagnosis Date Noted  . Femur fracture ([place]) 08/30/2017  . Protein-calorie malnutrition, severe 08/05/2015  . Sepsis ([place]) 08/03/2015  . UTI (lower urinary tract infection) 08/03/2015  . Delirium 08/03/2015  . Elevated troponin 08/03/2015    Palliative Care Assessment & Plan   Patient Profile:  EthelMcCainis L89 y.o.femalewith a known history of dementia (AAOx1), T2IDDM, HTN, HLD, CAD/MI, CVA, CKD, OAB, GERD, anemia, depression who p/w 1d Hx hip/leg pain. The scans were positive for a minimally impacted appearing superolateral subcapital impaction fracture of the right femoral neck on femoral head,  and a nondisplaced distal left femoral supracondylar fracture. Plans for  nonop management. Anemia and electrolyte imbalance noted.   Assessment/Recommendations/Plan:  Recommend palliative to follow.     Code Status:    Code Status Orders  (From admission, onward)        Start     Ordered   08/30/17 0504  Do not attempt resuscitation (DNR)  Continuous    Question Answer Comment  In the event of cardiac or respiratory ARREST Do not call a "code blue"   In the event of cardiac or respiratory ARREST Do not perform Intubation, CPR, defibrillation or ACLS   In the event of cardiac or respiratory ARREST Use medication by any route, position, wound care, and other measures to relive pain and suffering. May use oxygen, suction and manual treatment of airway obstruction as needed for comfort.      08/30/17 0503    Code Status History    Date Active Date Inactive Code Status Order ID Comments User Context   08/03/2015 1508 08/07/2015 1850 Full Code [id number]  Idelle Crouch, MD Inpatient    Advance Directive Documentation     Most Recent Value  Type of Advance Directive  Living will  Pre-existing out of facility DNR order (yellow form or pink MOST form)  -  "MOST" Form in Place?  -       Prognosis:   Poor.  Electrolyte imbalance, AKI, fractures, dementia    Care plan was discussed with MD.   Thank you for allowing the Palliative Medicine Team to assist in the care of this patient.   Total Time 1:00-1:15= 15 min 3:20-4:10= 50 min Prolonged Time Billed  yes      Greater than 50%  of this time was spent counseling and coordinating care related to the above assessment and plan.  Asencion Gowda, NP  Please contact Palliative Medicine Team phone at [phone] for questions and concerns.

## 2017-09-01 NOTE — Progress Notes (Signed)
[facility] [place] at [facility]   PATIENT NAME: Stephanie Willis    MR#:  [id number]  DATE OF BIRTH:  09-29-1921  SUBJECTIVE: Patient advanced dementia not able to give any history, very agitated.  Unable to reach the brother at the given numbers in the chart.  Not eating.  Patient is on pain medicines, started on Haldol for agitation.  CHIEF COMPLAINT:   Chief Complaint  Patient presents with  . Leg Pain    REVIEW OF SYSTEMS:   Review of Systems  Unable to perform ROS: Dementia   .   DRUG ALLERGIES:   Allergies  Allergen Reactions  . Penicillins Other (See Comments)    Reaction:  Unknown     VITALS:  Blood pressure (!) 160/60, pulse 64, temperature 98.7 F (37.1 C), temperature source Oral, resp. rate 18, height 5' (1.524 m), weight 44.9 kg (99 lb), SpO2 100 %.  PHYSICAL EXAMINATION:  GENERAL:  84 y.o.-year-old patient lying in the bed, baseline dementia, EYES: Pupils equal, round, reactive to light HEENT: Head atraumatic, normocephalic. Oropharynx and nasopharynx clear.  NECK:  Supple, no jugular venous distention. No thyroid enlargement, no tenderness.  LUNGS: Normal breath sounds bilaterally, no wheezing, rales,rhonchi or crepitation. No use of accessory muscles of respiration.  CARDIOVASCULAR: S1, S2 normal. No murmurs, rubs, or gallops.  ABDOMEN: Soft, nontender, nondistended. Bowel sounds present. No organomegaly or mass.  EXTREMITIES: No pedal edema, cyanosis, or clubbing.  Having pain with even with little movement of the legs. NEUROLOGIC:  severe dementia unable to do neurological exam because of her severe dementia   pSYCHIATRIC:  severe dementia.   SKIN: No obvious rash, lesion, or ulcer.    LABORATORY PANEL:   CBC Recent Labs  Lab 08/30/17 0043  08/31/17 1348  WBC 4.1  --   --   HGB 7.3*   < > 7.5*  HCT 22.7*  --  22.4*  PLT 210  --   --    < > = values in this interval not displayed.    ------------------------------------------------------------------------------------------------------------------  Chemistries  Recent Labs  Lab 08/30/17 0043  08/31/17 0738  NA 141   < > 141  K 7.0*   < > 5.6*  CL 116*   < > 113*  CO2 22   < > 24  GLUCOSE 208*   < > 111*  BUN 52*   < > 48*  CREATININE 2.14*   < > 1.96*  CALCIUM 8.2*   < > 7.9*  AST 10*  --   --   ALT 11*  --   --   ALKPHOS 67  --   --   BILITOT 0.2*  --   --    < > = values in this interval not displayed.   ------------------------------------------------------------------------------------------------------------------  Cardiac Enzymes Recent Labs  Lab 08/30/17 0043  TROPONINI 0.03*   ------------------------------------------------------------------------------------------------------------------  RADIOLOGY:  No results found.  EKG:   Orders placed or performed during the hospital encounter of 08/29/17  . ED EKG  . ED EKG    ASSESSMENT AND PLAN:   Impacted right hip, left distal femur fractures.  Patient has severe advanced dementia, multiple medical problems, nonoperative management recommended secondary to multiple medical problems, continue the immobilizer for the left leg, pain medicines with IV morphine.  Patient will be on DVT prophylaxis.  However I feel that patient needs to go to hospice home.  Unable to reach the family, unable to reach the brother and he  is the only contact that she has.  Will ask case manager to help to figure out another way of reaching brother. 2.  Acute on chronic anemia likely due to dilution and also due to anemia of chronic disease: Hemoglobin seven 6.7 today, transfuse 1 unit of packed RBC.  Continue heparin for DVT prophylaxis. 3.  Severe hyperkalemia required multiple doses of insulin with dextrose, bicarb, Kayexalate, Veltassa, potassium  decreased from 7-5.6.  Continue Veltassa. 4.  Acute renal failure secondary to poor p.o. intake and advanced dementia,  received gentle hydration, creatinine decreased from 2.14-1.96.  5.  Prognosis really poor due to multiple medical problems, and severe advanced dementia, recommend comfort care measures, palliative care is following unfortunately the brother who is only relative that she has he is not reachable.  #6  severe malnutrition #7 diabetes-type II, poor p.o. intake, blood sugar is around 143.  On very low-dose Lantus, continue insulin sliding scale coverage with sensitive scale.   All the records are reviewed and case discussed with Care Management/Social Workerr. Management plans discussed with the patient, family and they are in agreement.  CODE STATUS: DNR  TOTAL TIME TAKING CARE OF THIS PATIENT: .   POSSIBLE D/C IN 1-2DAYS, DEPENDING ON CLINICAL CONDITION.   Katha Hamming M.D on 09/01/2017 at 11:11 AM  Between 7am to 6pm - Pager - [phone]  After 6pm go to [email or web address] - password [email or web address] [facility]  [facility] [place] [facility]  Office  [phone]  CC: Primary care physician; Dimple Casey, MD   Note: This dictation was prepared with Dragon dictation along with smaller phrase technology. Any transcriptional errors that result from this process are unintentional.

## 2017-09-01 NOTE — Progress Notes (Signed)
Clinical Social Worker (CSW) met with patient's brother Melvin at bedside today. Per brother he is patient's HPOA and makes decisions for her. Brother is agreeable for patient to return to [facility]. Per MD patient will D/C tomorrow. Kelly admissions coordinator at [facility] is aware of above.    , LCSW [phone]  

## 2017-09-01 NOTE — Plan of Care (Signed)
Attempted to reach [place] at the work number provided, unable to reach him. Cell phone mail box remains full.

## 2017-09-02 LAB — GLUCOSE, CAPILLARY
Glucose-Capillary: 139 mg/dL — ABNORMAL HIGH (ref 65–99)
Glucose-Capillary: 75 mg/dL (ref 65–99)

## 2017-09-02 MED ORDER — OXYCODONE HCL 5 MG PO TABS: 2.5000 mg | ORAL_TABLET | Freq: Two times a day (BID) | ORAL | 0 refills | Status: AC | PRN

## 2017-09-02 MED ORDER — PATIROMER SORBITEX CALCIUM 8.4 G PO PACK: 8.4000 g | PACK | Freq: Every day | ORAL | 0 refills | Status: AC

## 2017-09-02 MED ORDER — LORAZEPAM 0.5 MG PO TABS: 0.5000 mg | ORAL_TABLET | Freq: Two times a day (BID) | ORAL | 0 refills | Status: AC | PRN

## 2017-09-02 MED ORDER — FLEET ENEMA 7-19 GM/118ML RE ENEM
1.0000 | ENEMA | Freq: Once | RECTAL | Status: AC
  Administered 2017-09-02: 1 via RECTAL

## 2017-09-02 NOTE — Plan of Care (Signed)
  Problem: Health Behavior/Discharge Planning: Goal: Ability to manage health-related needs will improve Outcome: Not Applicable   Problem: Clinical Measurements: Goal: Ability to maintain clinical measurements within normal limits will improve Outcome: Not Applicable Goal: Will remain free from infection Outcome: Progressing Goal: Diagnostic test results will improve Outcome: Progressing Goal: Respiratory complications will improve Outcome: Progressing Goal: Cardiovascular complication will be avoided Outcome: Progressing   Problem: Activity: Goal: Risk for activity intolerance will decrease Outcome: Not Applicable

## 2017-09-02 NOTE — Progress Notes (Signed)
PT Cancellation Note  Patient Details Name: Stephanie Willis MRN: [id number] DOB: February 09, 1922   Cancelled Treatment:    Reason Eval/Treat Not Completed: Medical issues which prohibited therapy; No new K+ labs drawn. Last taken 5.6 which is outside the parameters for participation in PT services. Will attempt at later date/time as pt is medically appropriate and available.      Ovidio Hanger PT, DPT 09/02/17, 2:35 PM

## 2017-09-02 NOTE — Progress Notes (Signed)
OT Cancellation Note  Patient Details Name: Stephanie Willis MRN: [id number] DOB: 11-Apr-1922   Cancelled Treatment:    Reason Eval/Treat Not Completed: Medical issues which prohibited therapy. No new K+ labs drawn. Last taken 5.6 which is outside the parameters for participation in OT services. Will attempt at later date/time as pt is medically appropriate and available.  Richrd Prime, MPH, MS, OTR/L ascom [phone] 09/02/17, 7:50 AM

## 2017-09-02 NOTE — Progress Notes (Signed)
Patient has not had MB during this hospital stay. MD Salary paged and received verbal orders for a fleet enema. Also, Per MD, discontinue Foley catheter and DC patient.

## 2017-09-02 NOTE — Discharge Summary (Signed)
[facility] [place] at [facility]   PATIENT NAME: Stephanie Willis    MR#:  [id number]  DATE OF BIRTH:  1979/05/26  DATE OF ADMISSION:  08/29/2017 ADMITTING PHYSICIAN: Cammy Copa, MD  DATE OF DISCHARGE: No discharge date for patient encounter.  PRIMARY CARE PHYSICIAN: Dimple Casey, MD    ADMISSION DIAGNOSIS:  Hyperkalemia [E87.5] Closed fracture of right hip, initial encounter (HCC) [S72.001A] Acute renal failure, unspecified acute renal failure type (HCC) [N17.9] Other closed fracture of distal end of left femur, initial encounter (HCC) [S72.492A]  DISCHARGE DIAGNOSIS:  Active Problems:   Femur fracture (HCC)   SECONDARY DIAGNOSIS:   Past Medical History:  Diagnosis Date  . Anemia   . Chronic kidney disease   . Dementia   . Depression   . Diabetes mellitus without complication (HCC)   . GERD (gastroesophageal reflux disease)   . Hypertension   . Myocardial infarction (HCC)   . Stroke [facility])     HOSPITAL COURSE:  *Impacted right hip, left distal femur fractures Patient has severe advanced dementia, multiple medical problems, nonoperative management recommended secondary to multiple medical problems continue the immobilizer for the left leg, pain medicines as needed And discussion with case management and patient's family-patient is to be discharged back to skilled nursing facility with resumption of palliative care services status post discharge  2.  Acute on chronic anemia  likely due to dilution and also due to anemia of chronic disease Improved status post transfusion  3.  Severe hyperkalemia  Treated with insulin with dextrose, bicarb, Kayexalate, Veltassa  4.  Acute renal failure  secondary to poor p.o. intake and advanced dementia received gentle hydration  5.    Chronic dementia with severe malnutrition  prognosis really poor due to multiple medical problems, and severe advanced dementia, recommend comfort care  measures palliative care continue to follow patient while in house, to be discharged on the same status post discharge   DISCHARGE CONDITIONS:  Condition stable, prognosis poor, resumption of palliative care services status post discharge to skilled nursing facility  CONSULTS OBTAINED:  Treatment Team:  Barbaraann Rondo, MD Deeann Saint, MD Jett Fukuda, Evelena Asa, MD  DRUG ALLERGIES:   Allergies  Allergen Reactions  . Penicillins Other (See Comments)    Reaction:  Unknown     DISCHARGE MEDICATIONS:   Allergies as of 09/02/2017      Reactions   Penicillins Other (See Comments)   Reaction:  Unknown       Medication List    STOP taking these medications   ciprofloxacin 250 MG tablet Commonly known as:  CIPRO     TAKE these medications   acetaminophen 500 MG tablet Commonly known as:  TYLENOL Take 1,000 mg by mouth every 6 (six) hours as needed for mild pain.   amLODipine 10 MG tablet Commonly known as:  NORVASC Take 1 tablet (10 mg total) by mouth daily.   docusate sodium 100 MG capsule Commonly known as:  COLACE Take 100 mg by mouth daily as needed for mild constipation.   donepezil 10 MG tablet Commonly known as:  ARICEPT Take 10 mg by mouth at bedtime.   ferrous sulfate 325 (65 FE) MG tablet Take 325 mg by mouth 3 (three) times daily with meals.   glucagon 1 MG Solr injection Commonly known as:  GLUCAGEN Inject 1 mg into the muscle once as needed for low blood sugar.   guaiFENesin 100 MG/5ML liquid Commonly known as:  ROBITUSSIN Take 200  mg by mouth 4 (four) times daily as needed for cough.   hydrALAZINE 100 MG tablet Commonly known as:  APRESOLINE Take 1 tablet (100 mg total) by mouth 3 (three) times daily.   insulin aspart 100 UNIT/ML FlexPen Commonly known as:  NOVOLOG Inject 2-8 Units into the skin 4 (four) times daily -  before meals and at bedtime. SSI   insulin glargine 100 UNIT/ML injection Commonly known as:  LANTUS Inject 6 Units into  the skin at bedtime.   ipratropium-albuterol 0.5-2.5 (3) MG/3ML Soln Commonly known as:  DUONEB Take 3 mLs by nebulization every 4 (four) hours as needed (shortness of breath).   lactobacillus acidophilus Tabs tablet Take 1 tablet by mouth 2 (two) times daily.   lamoTRIgine 25 MG tablet Commonly known as:  LAMICTAL Take 37.5 mg by mouth at bedtime.   LORazepam 0.5 MG tablet Commonly known as:  ATIVAN Take 1 tablet (0.5 mg total) by mouth 2 (two) times daily as needed for anxiety.   magnesium oxide 400 (241.3 Mg) MG tablet Commonly known as:  MAG-OX Take 400 mg by mouth daily.   metoprolol tartrate 25 MG tablet Commonly known as:  LOPRESSOR Take 0.5 tablets (12.5 mg total) by mouth 2 (two) times daily.   mirtazapine 15 MG tablet Commonly known as:  REMERON Take 15 mg by mouth at bedtime.   multivitamin Liqd Take 15 mLs by mouth daily.   omeprazole 20 MG capsule Commonly known as:  PRILOSEC Take 20 mg by mouth daily.   oxyCODONE 5 MG immediate release tablet Commonly known as:  Oxy IR/ROXICODONE Take 0.5 tablets (2.5 mg total) by mouth every 12 (twelve) hours as needed for moderate pain or severe pain.   senna-docusate 8.6-50 MG tablet Commonly known as:  Senokot-S Take 1 tablet by mouth daily.   simethicone 80 MG chewable tablet Commonly known as:  MYLICON Chew 160 mg by mouth 3 (three) times daily.        DISCHARGE INSTRUCTIONS:    If you experience worsening of your admission symptoms, develop shortness of breath, life threatening emergency, suicidal or homicidal thoughts you must seek medical attention immediately by calling 911 or calling your MD immediately  if symptoms less severe.  You Must read complete instructions/literature along with all the possible adverse reactions/side effects for all the Medicines you take and that have been prescribed to you. Take any new Medicines after you have completely understood and accept all the possible adverse  reactions/side effects.   Please note  You were cared for by a hospitalist during your hospital stay. If you have any questions about your discharge medications or the care you received while you were in the hospital after you are discharged, you can call the unit and asked to speak with the hospitalist on call if the hospitalist that took care of you is not available. Once you are discharged, your primary care physician will handle any further medical issues. Please note that NO REFILLS for any discharge medications will be authorized once you are discharged, as it is imperative that you return to your primary care physician (or establish a relationship with a primary care physician if you do not have one) for your aftercare needs so that they can reassess your need for medications and monitor your lab values.    Today   CHIEF COMPLAINT:   Chief Complaint  Patient presents with  . Leg Pain    HISTORY OF PRESENT ILLNESS:  84 y.o. female with a  known history of dementia (AAOx1), T2IDDM, HTN, HLD, CAD/MI, CVA, CKD, OAB, GERD, anemia, depression who p/w 1d Hx hip/leg pain. Pt w/ severe dementia, AAOx1 (to self only). She does not cooperate, answer questions or follow commands appropriately. She swats at me and pushes me away when I try to perform a physical examination. All she can tell me is her name, that she is cold, and that her hips hurt. She can provide no further Hx. ROS is also unobtainable, apart from the aforementioned.  Based on what I am able to glean from records and discussion w/ staff, pt is a resident of an SNF, and has reportedly been lying in bed screaming in pain for five days. It is unclear if there was any fall and/or injury. The pt cannot recall. Pt endorses pain in both hips/legs. Imaging performed in the ED demonstrates the following: -CT R hip: Minimally impacted appearing superolateral subcapital impaction fracture of the right femoral neck on femoral head. Findings  suspicious for subtle impaction fracture of the superolateral femoral head-neck junction. -CT L knee: Subtle transverse fracture of the distal femoral metaphysis is identified resulting in slight dorsal angulation of the femoral condyles. This fracture exits the medial and lateral supracondylar cortex, laterally and also medially for example. Supracondylar fracture of the distal femur with slight dorsal angulation of the femoral condyles.  Pt's K+ found to be 7.0 on admission. EKG (-) peaked TW. Pt rcd IVF (NS 500cc), Lasix, Bicarbonate, Albuterol, Insulin and Dextrose in ED, and is also ordered for Kayexalate.  As noted above, ROS unobtainable (2/2 dementia). Based on what I am told, pt's baseline functional status is poor, and it is reported that pt is largely bedbound at the SNF, though I cannot corroborate this. Pt also reportedly DNR/DNI. Pt's family is not reachable.     VITAL SIGNS:  Blood pressure (!) 152/72, pulse 73, temperature 98.1 F (36.7 C), temperature source Oral, resp. rate 16, height 5' (1.524 m), weight 44.9 kg (99 lb), SpO2 99 %.  I/O:    Intake/Output Summary (Last 24 hours) at 09/02/2017 1155 Last data filed at 09/02/2017 0615 Gross per 24 hour  Intake 100 ml  Output 650 ml  Net -550 ml    PHYSICAL EXAMINATION:  GENERAL:  84 y.o.-year-old patient lying in the bed with no acute distress.  EYES: Pupils equal, round, reactive to light and accommodation. No scleral icterus. Extraocular muscles intact.  HEENT: Head atraumatic, normocephalic. Oropharynx and nasopharynx clear.  NECK:  Supple, no jugular venous distention. No thyroid enlargement, no tenderness.  LUNGS: Normal breath sounds bilaterally, no wheezing, rales,rhonchi or crepitation. No use of accessory muscles of respiration.  CARDIOVASCULAR: S1, S2 normal. No murmurs, rubs, or gallops.  ABDOMEN: Soft, non-tender, non-distended. Bowel sounds present. No organomegaly or mass.  EXTREMITIES: No pedal edema,  cyanosis, or clubbing.  NEUROLOGIC: Cranial nerves II through XII are intact. Muscle strength 5/5 in all extremities. Sensation intact. Gait not checked.  PSYCHIATRIC: The patient is alert and oriented x 3.  SKIN: No obvious rash, lesion, or ulcer.   DATA REVIEW:   CBC Recent Labs  Lab 08/30/17 0043  08/31/17 1348  WBC 4.1  --   --   HGB 7.3*   < > 7.5*  HCT 22.7*  --  22.4*  PLT 210  --   --    < > = values in this interval not displayed.    Chemistries  Recent Labs  Lab 08/30/17 0043  08/31/17 0738  NA  141   < > 141  K 7.0*   < > 5.6*  CL 116*   < > 113*  CO2 22   < > 24  GLUCOSE 208*   < > 111*  BUN 52*   < > 48*  CREATININE 2.14*   < > 1.96*  CALCIUM 8.2*   < > 7.9*  AST 10*  --   --   ALT 11*  --   --   ALKPHOS 67  --   --   BILITOT 0.2*  --   --    < > = values in this interval not displayed.    Cardiac Enzymes Recent Labs  Lab 08/30/17 0043  TROPONINI 0.03*    Microbiology Results  Results for orders placed or performed during the hospital encounter of 08/29/17  MRSA PCR Screening     Status: None   Collection Time: 08/30/17  5:17 AM  Result Value Ref Range Status   MRSA by PCR NEGATIVE NEGATIVE Final    Comment:        The GeneXpert MRSA Assay (FDA approved for NASAL specimens only), is one component of a comprehensive MRSA colonization surveillance program. It is not intended to diagnose MRSA infection nor to guide or monitor treatment for MRSA infections. Performed at [facility], [address] [place], [place] [address]     RADIOLOGY:  No results found.  EKG:   Orders placed or performed during the hospital encounter of 08/29/17  . ED EKG  . ED EKG      Management plans discussed with the patient, family and they are in agreement.  CODE STATUS:     Code Status Orders  (From admission, onward)        Start     Ordered   08/30/17 0504  Do not attempt resuscitation (DNR)  Continuous    Question Answer  Comment  In the event of cardiac or respiratory ARREST Do not call a "code blue"   In the event of cardiac or respiratory ARREST Do not perform Intubation, CPR, defibrillation or ACLS   In the event of cardiac or respiratory ARREST Use medication by any route, position, wound care, and other measures to relive pain and suffering. May use oxygen, suction and manual treatment of airway obstruction as needed for comfort.      08/30/17 0503    Code Status History    Date Active Date Inactive Code Status Order ID Comments User Context   08/03/2015 1508 08/07/2015 1850 Full Code [id number]  Marguarite Arbour, MD Inpatient    Advance Directive Documentation     Most Recent Value  Type of Advance Directive  Living will  Pre-existing out of facility DNR order (yellow form or pink MOST form)  -  "MOST" Form in Place?  -      TOTAL TIME TAKING CARE OF THIS PATIENT: 45 minutes.    Evelena Asa Zameria Vogl M.D on 09/02/2017 at 11:55 AM  Between 7am to 6pm - Pager - [phone]  After 6pm go to [email or web address] - password [email or web address]  [facility]  Office  [phone]  CC: Primary care physician; Dimple Casey, MD   Note: This dictation was prepared with Dragon dictation along with smaller phrase technology. Any transcriptional errors that result from this process are unintentional.

## 2017-09-02 NOTE — Progress Notes (Signed)
Report called to [facility]. Report take by [facility]. EMS called for transportation

## 2017-09-02 NOTE — Progress Notes (Signed)
Patient is medically stable for D/C to [facility] today. Per [facility] admissions coordinator at [facility] patient can come today. RN will call report and arrange EMS for transport. Patient was open to palliative care before admission to [facility] and will continue to be followed by palliative care. Clydie Braun [place]/ palliative care liaison is aware of above. Clinical Child psychotherapist (CSW) sent D/C orders to [facility] via [place]. CSW contacted patient's brother Alinda Money and made him aware of above. Please reconsult if future social work needs arise. CSW signing off.   [employer], LCSW [phone]

## 2017-09-03 NOTE — Care Management Important Message (Signed)
Important Message  Patient Details  Name: Stephanie Willis MRN: [id number] Date of Birth: 1921/12/26   Medicare Important Message Given:  Other (see comment)  Late entry.  Patient was not able to sign IM and no family present.    Olegario Messier A [name] 09/03/2017, 11:32 AM

## 2018-04-07 ENCOUNTER — Encounter: Payer: Self-pay | Admitting: Nurse Practitioner

## 2018-04-07 ENCOUNTER — Non-Acute Institutional Stay: Payer: Medicare Other | Admitting: Nurse Practitioner

## 2018-04-07 ENCOUNTER — Non-Acute Institutional Stay: Payer: Self-pay | Admitting: Nurse Practitioner

## 2018-04-07 VITALS — BP 112/76 | HR 82 | Temp 98.0°F | Resp 18 | Ht 61.0 in | Wt 85.9 lb

## 2018-04-07 DIAGNOSIS — R531 Weakness: Secondary | ICD-10-CM

## 2018-04-07 DIAGNOSIS — R630 Anorexia: Secondary | ICD-10-CM

## 2018-04-07 DIAGNOSIS — R451 Restlessness and agitation: Secondary | ICD-10-CM

## 2018-04-07 DIAGNOSIS — Z515 Encounter for palliative care: Secondary | ICD-10-CM

## 2018-04-07 DIAGNOSIS — E43 Unspecified severe protein-calorie malnutrition: Secondary | ICD-10-CM

## 2018-04-07 NOTE — Progress Notes (Signed)
[facility] Telephone: [phone] Fax: [phone]  PATIENT NAME: Stephanie Willis DOB: 1921-05-20 MRN: [id number]  PRIMARY CARE PROVIDER:   Dimple Casey, MD  REFERRING PROVIDER:  Dimple Casey, MD [address] [place], [place] [address]  RESPONSIBLE PARTY:   Tammie Yanda brother Health Care power-of-attorney [phone]  ASSESSMENT:     I visited and observed Ms Bonser. She did arouse to verbal cues briefly opening your eyes and looking at me and returning to sleep. She does appear comfortable though chronically ill. She was cooperative with exam. No meaningful discussion due to severe cognitive impairment. She continues to decline in the setting of dementia reflective of poor appetite and ongoing weight loss. Medical goals have been to focus on treat what is treatable with previous discussions with her brother Alinda Money. Hospice screening has been suggested on multiple occasions and he has declined will revisit with Alinda Money option of hospice screening as Ms Hebel continues to decline in the setting of dementia, protein calorie malnutrition. Encourage staff to continue to attempt to give her medicine so at times she does refuse. Seroquel does appear to be improving symptoms of agitation when she does not refuse her medication. I attempted to contact Alinda Money, message left. DNR remains in place. Emotional support provided.  8 / 20 / 2018 weight 104.0 10 / 8 / 2019 weight 99.8 lbs 11 / 6 / 2018 weight 106.3 lbs 12 / 4 / 2019 weight 85.9 lbs BMI 16.2  RECOMMENDATIONS and PLAN:  1. Agitation R45.1 secondary to end stage dementia continue to attempt to encouraged to take medications. Seroquel appears to be effective when administered and she does not refuse.  2. Anorexia R63.0. secondary to dysphasia related to dementia. Continue to offer Comfort feedings, monitor for aspiration as she is a high-risk. Appetite stimulants were attempted in the past though since she is refusing  medications will try to minimize pill burden.  3. Generalized weakness R53.1 secondary overall deconditioning in the setting of chronic disease. Continue to lift to the recliner and encourage Mobility as able. Support of interventions and continue to monitor symptoms of pain with immobility.  4. Palliative care encounter Z51.5; Ongoing monitoring chronic disease management, symptoms, goc with emotional support. Medical goals wishes IV fluids, antibiotic therapy, transfusions, hospitalizations if necessary. No intubation, surgical intervention, feeding tube. She is a DNR   I spent 60 minutes providing this consultation, from 9:00am to 10:00am. More than 50% of the time in this consultation was spent coordinating communication.   HISTORY OF PRESENT ILLNESS:  [name] is a 84 y.o. year old female with multiple medical problems including CVA, coronary artery disease, myocardial infarction, diabetes, hypertension, hyperlipidemia, chronic kidney disease, gerd, vascular dementia, anemia, vitamin D deficiency, incontinence. She continues to reside at skilled [facility], functionally quadriplegic, total ADL dependence and unable to feed herself. Staff attempts to feed although she does refuse food and medications at times. She is on a mince diet moist texture level 2 with thickened consistency. She continues to lose weight. She also has difficulty with behaviors with intermittent yelling with recently initiated Seroquel which seems to have helped her staff. Medical goals with focus on Comfort though Alinda Money, her brother's wishes are to continue to treat what is treatable in previous palliative medicine discussions. He has declined Education officer, museum. She is severely cognitively impaired unable to answer questions or verbalize her needs. At present she is lying in [place] Chair at the nurses station, chronically ill, very thin debilitated  with her eyes closed. She appears comfortable. No visitors  present. Palliative Care was asked to help address goals of care.   CODE STATUS: DNR  PPS: 0% HOSPICE ELIGIBILITY/DIAGNOSIS: Possible <6 months life expectancy; protein calorie malnutrition secondary to Alzheimer's dementia reflective in functional, cognitive decline with weight loss  PAST MEDICAL HISTORY:  Past Medical History:  Diagnosis Date  . Anemia   . Chronic kidney disease   . Dementia   . Depression   . Diabetes mellitus without complication (HCC)   . GERD (gastroesophageal reflux disease)   . Hypertension   . Myocardial infarction (HCC)   . Stroke [facility](HCC)     SOCIAL HX:  Social History   Tobacco Use  . Smoking status: Never Smoker  . Smokeless tobacco: Never Used  Substance Use Topics  . Alcohol use: Not Currently    ALLERGIES:  Allergies  Allergen Reactions  . Penicillins Other (See Comments)    Reaction:  Unknown      PERTINENT MEDICATIONS:  Outpatient Encounter Medications as of 04/07/2018  Medication Sig  . acetaminophen (TYLENOL) 500 MG tablet Take 1,000 mg by mouth every 6 (six) hours as needed for mild pain.   Marland Kitchen. amLODipine (NORVASC) 10 MG tablet Take 1 tablet (10 mg total) by mouth daily.  Marland Kitchen. docusate sodium (COLACE) 100 MG capsule Take 100 mg by mouth daily as needed for mild constipation.   Marland Kitchen. donepezil (ARICEPT) 10 MG tablet Take 10 mg by mouth at bedtime.  . ferrous sulfate 325 (65 FE) MG tablet Take 325 mg by mouth 3 (three) times daily with meals.  Marland Kitchen. glucagon (GLUCAGEN) 1 MG SOLR injection Inject 1 mg into the muscle once as needed for low blood sugar.  Marland Kitchen. guaiFENesin (ROBITUSSIN) 100 MG/5ML liquid Take 200 mg by mouth 4 (four) times daily as needed for cough.  . hydrALAZINE (APRESOLINE) 100 MG tablet Take 1 tablet (100 mg total) by mouth 3 (three) times daily.  . insulin aspart (NOVOLOG) 100 UNIT/ML FlexPen Inject 2-8 Units into the skin 4 (four) times daily -  before meals and at bedtime. SSI  . insulin glargine (LANTUS) 100 UNIT/ML injection Inject  6 Units into the skin at bedtime.  Marland Kitchen. ipratropium-albuterol (DUONEB) 0.5-2.5 (3) MG/3ML SOLN Take 3 mLs by nebulization every 4 (four) hours as needed (shortness of breath).  . lactobacillus acidophilus (BACID) TABS tablet Take 1 tablet by mouth 2 (two) times daily.   Marland Kitchen. lamoTRIgine (LAMICTAL) 25 MG tablet Take 37.5 mg by mouth at bedtime.  Marland Kitchen. LORazepam (ATIVAN) 0.5 MG tablet Take 1 tablet (0.5 mg total) by mouth 2 (two) times daily as needed for anxiety.  . magnesium oxide (MAG-OX) 400 (241.3 Mg) MG tablet Take 400 mg by mouth daily.  . metoprolol tartrate (LOPRESSOR) 25 MG tablet Take 0.5 tablets (12.5 mg total) by mouth 2 (two) times daily.  . mirtazapine (REMERON) 15 MG tablet Take 15 mg by mouth at bedtime.  . Multiple Vitamin (MULTIVITAMIN) LIQD Take 15 mLs by mouth daily.  Marland Kitchen. omeprazole (PRILOSEC) 20 MG capsule Take 20 mg by mouth daily.  Marland Kitchen. oxyCODONE (OXY IR/ROXICODONE) 5 MG immediate release tablet Take 0.5 tablets (2.5 mg total) by mouth every 12 (twelve) hours as needed for moderate pain or severe pain.  . patiromer (VELTASSA) 8.4 g packet Take 1 packet (8.4 g total) by mouth daily.  Marland Kitchen. senna-docusate (SENOKOT-S) 8.6-50 MG tablet Take 1 tablet by mouth daily.  . simethicone (MYLICON) 80 MG chewable tablet Chew 160 mg by mouth  3 (three) times daily.   No facility-administered encounter medications on file as of 04/07/2018.     PHYSICAL EXAM:   General: NAD, frail appearing, thin, chronically ill, severely cognitively impaired Cardiovascular: regular rate and rhythm Pulmonary: clear ant fields Abdomen: soft, nontender, + bowel sounds Extremities: muscle wasting, atrophy Skin: no rashes Neurological: functionally quadriplegic   Christin Prince [place], NP

## 2018-06-04 DEATH — deceased
